# Patient Record
Sex: Male | Born: 1945 | Race: White | Hispanic: No | Marital: Single | Smoking: Former smoker
Health system: Southern US, Community
[De-identification: ages and names within clinical notes are randomized; demographics above are authoritative.]

## PROBLEM LIST (undated history)

## (undated) DIAGNOSIS — K219 Gastro-esophageal reflux disease without esophagitis: Secondary | ICD-10-CM

## (undated) DIAGNOSIS — G629 Polyneuropathy, unspecified: Secondary | ICD-10-CM

## (undated) DIAGNOSIS — R29898 Other symptoms and signs involving the musculoskeletal system: Secondary | ICD-10-CM

## (undated) DIAGNOSIS — M898X9 Other specified disorders of bone, unspecified site: Secondary | ICD-10-CM

## (undated) DIAGNOSIS — M1712 Unilateral primary osteoarthritis, left knee: Secondary | ICD-10-CM

## (undated) DIAGNOSIS — N401 Enlarged prostate with lower urinary tract symptoms: Secondary | ICD-10-CM

## (undated) DIAGNOSIS — M779 Enthesopathy, unspecified: Secondary | ICD-10-CM

## (undated) DIAGNOSIS — Z972 Presence of dental prosthetic device (complete) (partial): Secondary | ICD-10-CM

## (undated) DIAGNOSIS — M2040 Other hammer toe(s) (acquired), unspecified foot: Secondary | ICD-10-CM

## (undated) DIAGNOSIS — K589 Irritable bowel syndrome without diarrhea: Secondary | ICD-10-CM

## (undated) DIAGNOSIS — I341 Nonrheumatic mitral (valve) prolapse: Secondary | ICD-10-CM

## (undated) DIAGNOSIS — I10 Essential (primary) hypertension: Secondary | ICD-10-CM

## (undated) HISTORY — DX: Other specified disorders of bone, unspecified site: M89.8X9

## (undated) HISTORY — DX: Nonrheumatic mitral (valve) prolapse: I34.1

## (undated) HISTORY — PX: EYE SURGERY: SHX253

## (undated) HISTORY — DX: Other hammer toe(s) (acquired), unspecified foot: M20.40

## (undated) HISTORY — DX: Essential (primary) hypertension: I10

## (undated) HISTORY — DX: Benign prostatic hyperplasia with lower urinary tract symptoms: N40.1

## (undated) HISTORY — DX: Irritable bowel syndrome without diarrhea: K58.9

## (undated) HISTORY — DX: Unilateral primary osteoarthritis, left knee: M17.12

## (undated) HISTORY — DX: Enthesopathy, unspecified: M77.9

---

## 1976-11-16 HISTORY — PX: URETEROSCOPY WITH HOLMIUM LASER LITHOTRIPSY: SHX6645

## 2004-12-29 ENCOUNTER — Ambulatory Visit: Payer: Self-pay

## 2006-03-03 ENCOUNTER — Emergency Department: Payer: Self-pay | Admitting: Emergency Medicine

## 2006-05-06 ENCOUNTER — Ambulatory Visit: Payer: Self-pay | Admitting: Specialist

## 2010-03-10 ENCOUNTER — Ambulatory Visit: Payer: Self-pay | Admitting: Urology

## 2012-08-21 ENCOUNTER — Emergency Department: Payer: Self-pay | Admitting: Emergency Medicine

## 2012-08-21 LAB — CBC
HGB: 15 g/dL (ref 13.0–18.0)
MCH: 34.5 pg — ABNORMAL HIGH (ref 26.0–34.0)
MCHC: 35.1 g/dL (ref 32.0–36.0)
Platelet: 180 10*3/uL (ref 150–440)
RDW: 12.7 % (ref 11.5–14.5)

## 2012-08-21 LAB — COMPREHENSIVE METABOLIC PANEL
Alkaline Phosphatase: 68 U/L (ref 50–136)
Anion Gap: 8 (ref 7–16)
Calcium, Total: 9.4 mg/dL (ref 8.5–10.1)
Chloride: 109 mmol/L — ABNORMAL HIGH (ref 98–107)
Co2: 26 mmol/L (ref 21–32)
Creatinine: 1.12 mg/dL (ref 0.60–1.30)
EGFR (African American): >60
EGFR (Non-African Amer.): >60
Potassium: 4.2 mmol/L (ref 3.5–5.1)
Sodium: 143 mmol/L (ref 136–145)

## 2012-08-21 LAB — URINALYSIS, COMPLETE
Leukocyte Esterase: NEGATIVE
Protein: 25
RBC,UR: 604 /HPF (ref 0–5)
WBC UR: 3 /HPF (ref 0–5)

## 2012-09-01 ENCOUNTER — Ambulatory Visit: Payer: Self-pay | Admitting: Urology

## 2012-09-01 DIAGNOSIS — N401 Enlarged prostate with lower urinary tract symptoms: Secondary | ICD-10-CM

## 2012-09-01 HISTORY — DX: Benign prostatic hyperplasia with lower urinary tract symptoms: N40.1

## 2013-07-11 ENCOUNTER — Ambulatory Visit: Payer: Self-pay | Admitting: Urology

## 2014-02-20 ENCOUNTER — Ambulatory Visit: Payer: Self-pay | Admitting: Urology

## 2014-02-22 ENCOUNTER — Encounter: Payer: Self-pay | Admitting: Podiatry

## 2014-02-22 ENCOUNTER — Ambulatory Visit (INDEPENDENT_AMBULATORY_CARE_PROVIDER_SITE_OTHER): Payer: Medicare PPO | Admitting: Podiatry

## 2014-02-22 VITALS — Resp 16 | Ht 68.0 in | Wt 155.0 lb

## 2014-02-22 DIAGNOSIS — M7750 Other enthesopathy of unspecified foot: Secondary | ICD-10-CM

## 2014-02-22 DIAGNOSIS — M779 Enthesopathy, unspecified: Principal | ICD-10-CM

## 2014-02-22 DIAGNOSIS — M2040 Other hammer toe(s) (acquired), unspecified foot: Secondary | ICD-10-CM

## 2014-02-22 DIAGNOSIS — M778 Other enthesopathies, not elsewhere classified: Secondary | ICD-10-CM

## 2014-02-22 NOTE — Progress Notes (Signed)
He presents today complaining of pain to the second metatarsophalangeal joint of his left foot. He like to see about getting orthotics made to help offload that area.  Objective: Vital signs are stable he is alert and oriented x3. He has a painful hammertoe deformity second metatarsophalangeal joint of the left foot dorsiflexion and medial deviation a dislocation at the metatarsophalangeal joint. He has pain on end range of motion of the metatarsophalangeal joint. He also has a plantarflexed second metatarsal.  Assessment: Hammertoe deformity with capsulitis second metatarsophalangeal joint of the left foot.  Plan: Discussed etiology pathology conservative versus surgical therapies. An injection around the joint today with Kenalog and local anesthetic and he was scan for orthotics.

## 2014-03-05 ENCOUNTER — Encounter: Payer: Self-pay | Admitting: *Deleted

## 2014-03-05 NOTE — Progress Notes (Signed)
Sent pt post card letting him know orthotics are here. 

## 2014-12-18 DIAGNOSIS — K589 Irritable bowel syndrome without diarrhea: Secondary | ICD-10-CM

## 2014-12-18 DIAGNOSIS — F419 Anxiety disorder, unspecified: Secondary | ICD-10-CM | POA: Insufficient documentation

## 2014-12-18 HISTORY — DX: Irritable bowel syndrome, unspecified: K58.9

## 2015-01-28 ENCOUNTER — Ambulatory Visit: Payer: Self-pay | Admitting: Gastroenterology

## 2015-01-29 ENCOUNTER — Ambulatory Visit: Payer: Self-pay | Admitting: Gastroenterology

## 2015-03-11 LAB — SURGICAL PATHOLOGY

## 2015-08-05 ENCOUNTER — Other Ambulatory Visit: Payer: Self-pay | Admitting: Physician Assistant

## 2015-08-05 DIAGNOSIS — M5412 Radiculopathy, cervical region: Secondary | ICD-10-CM

## 2015-08-14 ENCOUNTER — Ambulatory Visit
Admission: RE | Admit: 2015-08-14 | Discharge: 2015-08-14 | Disposition: A | Discharge status: Home / Self Care | Payer: Medicare PPO | Source: Ambulatory Visit | Attending: Physician Assistant | Admitting: Physician Assistant

## 2015-08-14 DIAGNOSIS — M5412 Radiculopathy, cervical region: Secondary | ICD-10-CM | POA: Diagnosis present

## 2015-08-14 DIAGNOSIS — M4802 Spinal stenosis, cervical region: Secondary | ICD-10-CM | POA: Diagnosis not present

## 2015-08-14 DIAGNOSIS — M5032 Other cervical disc degeneration, mid-cervical region: Secondary | ICD-10-CM | POA: Diagnosis not present

## 2016-02-04 ENCOUNTER — Encounter: Payer: Self-pay | Admitting: Podiatry

## 2016-02-04 ENCOUNTER — Ambulatory Visit (INDEPENDENT_AMBULATORY_CARE_PROVIDER_SITE_OTHER): Payer: Medicare Other

## 2016-02-04 ENCOUNTER — Ambulatory Visit (INDEPENDENT_AMBULATORY_CARE_PROVIDER_SITE_OTHER): Payer: Medicare Other | Admitting: Podiatry

## 2016-02-04 VITALS — BP 144/95 | HR 90 | Resp 18

## 2016-02-04 DIAGNOSIS — R52 Pain, unspecified: Secondary | ICD-10-CM

## 2016-02-04 DIAGNOSIS — M7752 Other enthesopathy of left foot: Secondary | ICD-10-CM

## 2016-02-04 DIAGNOSIS — M779 Enthesopathy, unspecified: Secondary | ICD-10-CM

## 2016-02-04 DIAGNOSIS — M2040 Other hammer toe(s) (acquired), unspecified foot: Secondary | ICD-10-CM | POA: Diagnosis not present

## 2016-02-04 DIAGNOSIS — M898X9 Other specified disorders of bone, unspecified site: Secondary | ICD-10-CM | POA: Diagnosis not present

## 2016-02-04 DIAGNOSIS — M778 Other enthesopathies, not elsewhere classified: Secondary | ICD-10-CM

## 2016-02-05 ENCOUNTER — Encounter: Payer: Self-pay | Admitting: Podiatry

## 2016-02-05 DIAGNOSIS — M898X9 Other specified disorders of bone, unspecified site: Secondary | ICD-10-CM | POA: Insufficient documentation

## 2016-02-05 DIAGNOSIS — M778 Other enthesopathies, not elsewhere classified: Secondary | ICD-10-CM | POA: Insufficient documentation

## 2016-02-05 DIAGNOSIS — M779 Enthesopathy, unspecified: Secondary | ICD-10-CM

## 2016-02-05 DIAGNOSIS — M2040 Other hammer toe(s) (acquired), unspecified foot: Secondary | ICD-10-CM

## 2016-02-05 HISTORY — DX: Other hammer toe(s) (acquired), unspecified foot: M20.40

## 2016-02-05 HISTORY — DX: Other enthesopathies, not elsewhere classified: M77.8

## 2016-02-05 HISTORY — DX: Other specified disorders of bone, unspecified site: M89.8X9

## 2016-02-05 NOTE — Progress Notes (Signed)
Patient ID: Roger Watson, male   DOB: October 29, 1946, 70 y.o.   MRN: [id number]  Subjective: 70 year old male presents the office today for pain to his left foot. He gets pain in between the second third toes on the second MPJ. He has previous he had injections this area which seems to help. He states has had a bump on the top of his left foot and to the care and over time he has had issues to his left foot for the toes. No recent injury or trauma. No tingling or numbness. No acute changes since he was seen last. Denies any systemic complaints such as fevers, chills, nausea, vomiting. No acute changes since last appointment, and no other complaints at this time.   Objective: AAO x3, NAD DP/PT pulses palpable bilaterally, CRT less than 3 seconds Protective sensation intact with Simms Weinstein monofilament, This is a prominence the dorsal medial aspect of the midfoot with slight irritation from shoe gear. No tenderness along the area at this time. There is a slight hallux varus presents the left side in the second digit is contracted and subluxed dorsally at the level of the MPJ and there is a rigid hammertoe contracture present the second toe. The toe has drifted medially. The remote is also hammered. There is tenderness palpation along the lateral portion of the second MTPJ on the second interspace. There is no palpable neuroma identified. No overlying erythema or increase in warmth. No areas of pinpoint bony tenderness or pain with vibratory sensation. MMT 5/5, ROM WNL. No edema, erythema, increase in warmth to bilateral lower extremities.  No open lesions or pre-ulcerative lesions.  No pain with calf compression, swelling, warmth, erythema  Assessment: 70 year old male second MTPJ capsulitis, intermetatarsal pain.  Plan: -All treatment options discussed with the patient including all alternatives, risks, complications.  -X-rays were obtained and reviewed. No evidence of acute fracture. -At  this time the patient is requesting steroid injection the symptomatic area. Under sterile conditions a mixture of Kenalog and local anesthetic was infiltrated without complications. Postinjection care is discussed. -He brought and multiple pairs of orthotics they which were evaluated. Continue with orthotics. -Follow-up as needed. -Patient encouraged to call the office with any questions, concerns, change in symptoms.   Celesta Gentile, DPM

## 2016-04-14 ENCOUNTER — Encounter: Payer: Self-pay | Admitting: Podiatry

## 2016-04-14 ENCOUNTER — Ambulatory Visit (INDEPENDENT_AMBULATORY_CARE_PROVIDER_SITE_OTHER): Payer: Medicare Other | Admitting: Podiatry

## 2016-04-14 VITALS — BP 125/81 | HR 94 | Resp 18

## 2016-04-14 DIAGNOSIS — M778 Other enthesopathies, not elsewhere classified: Secondary | ICD-10-CM

## 2016-04-14 DIAGNOSIS — M7752 Other enthesopathy of left foot: Secondary | ICD-10-CM

## 2016-04-14 DIAGNOSIS — D3610 Benign neoplasm of peripheral nerves and autonomic nervous system, unspecified: Secondary | ICD-10-CM | POA: Diagnosis not present

## 2016-04-14 DIAGNOSIS — M779 Enthesopathy, unspecified: Principal | ICD-10-CM

## 2016-04-14 NOTE — Progress Notes (Signed)
Patient ID: Roger Watson, male   DOB: 11-18-1945, 70 y.o.   MRN: [id number]  Subjective: 70 year old male presents the office today for follow-up with vibration of left foot pain. He states that the area of the previous injection is doing well however he points the third interspace and the third and fourth metatarsal heads but he gets the majority of tenderness at this time. He states the areas painful weightbearing and pressure. No swelling or redness. No recent injury or trauma. Denies any systemic complaints such as fevers, chills, nausea, vomiting. No acute changes since last appointment, and no other complaints at this time.   Objective: AAO x3, NAD DP/PT pulses palpable bilaterally, CRT less than 3 seconds There is no tenderness palpation on the second interspace and the second MTPJ this time. The majority tenderness appears to be of third interspace. There is prominence the metatarsal heads plantarly with atrophy of fat pad. Hallux varus is present left side of the second digit is in a contracted and subluxed dorsally position. Hammertoe contractures are also present. The second digit has drifted medially.  No areas of pinpoint bony tenderness or pain with vibratory sensation. MMT 5/5, ROM WNL. No edema, erythema, increase in warmth to bilateral lower extremities.  No open lesions or pre-ulcerative lesions.  No pain with calf compression, swelling, warmth, erythema  Assessment: Third interspace pain  Plan: -All treatment options discussed with the patient including all alternatives, risks, complications.  -I discussed treatment options including steroid injection which he wishes to proceed with. Under sterile conditions a mixture of Kenalog and local anesthetic was infiltrated into the area of maximal tenderness without complications. Post injection care was discussed. -I modified his orthotic to add a metatarsal pad. -Ice to the area -Follow-up as scheduled -Patient encouraged to  call the office with any questions, concerns, change in symptoms.   Celesta Gentile, DPM

## 2016-05-12 ENCOUNTER — Ambulatory Visit: Payer: Medicare Other | Admitting: Podiatry

## 2016-10-19 DIAGNOSIS — I341 Nonrheumatic mitral (valve) prolapse: Secondary | ICD-10-CM

## 2016-10-19 HISTORY — DX: Nonrheumatic mitral (valve) prolapse: I34.1

## 2016-11-16 HISTORY — PX: JOINT REPLACEMENT: SHX530

## 2016-12-17 ENCOUNTER — Other Ambulatory Visit: Payer: Self-pay | Admitting: Gastroenterology

## 2016-12-17 DIAGNOSIS — R1011 Right upper quadrant pain: Secondary | ICD-10-CM

## 2016-12-22 ENCOUNTER — Ambulatory Visit
Admission: RE | Admit: 2016-12-22 | Discharge: 2016-12-22 | Disposition: A | Discharge status: Home / Self Care | Payer: Medicare Other | Source: Ambulatory Visit | Attending: Gastroenterology | Admitting: Gastroenterology

## 2016-12-22 DIAGNOSIS — K224 Dyskinesia of esophagus: Secondary | ICD-10-CM | POA: Diagnosis not present

## 2016-12-22 DIAGNOSIS — R1011 Right upper quadrant pain: Secondary | ICD-10-CM

## 2016-12-22 DIAGNOSIS — I77811 Abdominal aortic ectasia: Secondary | ICD-10-CM | POA: Insufficient documentation

## 2016-12-22 DIAGNOSIS — K219 Gastro-esophageal reflux disease without esophagitis: Secondary | ICD-10-CM | POA: Insufficient documentation

## 2016-12-22 DIAGNOSIS — R932 Abnormal findings on diagnostic imaging of liver and biliary tract: Secondary | ICD-10-CM | POA: Diagnosis not present

## 2017-01-29 ENCOUNTER — Emergency Department
Admission: EM | Admit: 2017-01-29 | Discharge: 2017-01-29 | Disposition: A | Discharge status: Home / Self Care | Payer: Medicare Other | Attending: Emergency Medicine | Admitting: Emergency Medicine

## 2017-01-29 ENCOUNTER — Encounter: Payer: Self-pay | Admitting: Emergency Medicine

## 2017-01-29 DIAGNOSIS — I10 Essential (primary) hypertension: Secondary | ICD-10-CM | POA: Insufficient documentation

## 2017-01-29 DIAGNOSIS — R531 Weakness: Secondary | ICD-10-CM | POA: Diagnosis present

## 2017-01-29 DIAGNOSIS — Z79899 Other long term (current) drug therapy: Secondary | ICD-10-CM | POA: Diagnosis not present

## 2017-01-29 HISTORY — DX: Gastro-esophageal reflux disease without esophagitis: K21.9

## 2017-01-29 LAB — BASIC METABOLIC PANEL
ANION GAP: 9 (ref 5–15)
BUN: 14 mg/dL (ref 6–20)
CALCIUM: 9.6 mg/dL (ref 8.9–10.3)
CHLORIDE: 103 mmol/L (ref 101–111)
CO2: 24 mmol/L (ref 22–32)
Creatinine, Ser: 0.89 mg/dL (ref 0.61–1.24)
GFR calc non Af Amer: >60 mL/min (ref >60)
Glucose, Bld: 92 mg/dL (ref 65–99)
POTASSIUM: 3.9 mmol/L (ref 3.5–5.1)
Sodium: 136 mmol/L (ref 135–145)

## 2017-01-29 LAB — URINALYSIS, COMPLETE (UACMP) WITH MICROSCOPIC
Bacteria, UA: NONE SEEN
Bilirubin Urine: NEGATIVE
Glucose, UA: NEGATIVE mg/dL
HGB URINE DIPSTICK: NEGATIVE
KETONES UR: 20 mg/dL — AB
Leukocytes, UA: NEGATIVE
Nitrite: NEGATIVE
PROTEIN: NEGATIVE mg/dL
Specific Gravity, Urine: 1.013 (ref 1.005–1.030)
Squamous Epithelial / LPF: NONE SEEN
pH: 5 (ref 5.0–8.0)

## 2017-01-29 LAB — CBC
HCT: 44.3 % (ref 40.0–52.0)
HEMOGLOBIN: 15.5 g/dL (ref 13.0–18.0)
MCH: 33.6 pg (ref 26.0–34.0)
MCHC: 34.9 g/dL (ref 32.0–36.0)
MCV: 96.4 fL (ref 80.0–100.0)
Platelets: 195 10*3/uL (ref 150–440)
RBC: 4.6 MIL/uL (ref 4.40–5.90)
RDW: 13.2 % (ref 11.5–14.5)
WBC: 6.9 10*3/uL (ref 3.8–10.6)

## 2017-01-29 LAB — TROPONIN I
Troponin I: <0.03 ng/mL (ref <0.03)
Troponin I: <0.03 ng/mL (ref <0.03)

## 2017-01-29 MED ORDER — HYDROCHLOROTHIAZIDE 12.5 MG PO CAPS: 12.5000 mg | ORAL_CAPSULE | Freq: Every day | ORAL | 2 refills | Status: DC

## 2017-01-29 NOTE — ED Provider Notes (Signed)
[facility] Emergency Department Provider Note  Time seen: 5:23 PM  I have reviewed the triage vital signs and the nursing notes.   HISTORY  Chief Complaint Fatigue    HPI Roger Watson is a 71 y.o. male with a past medical history of anxiety, gastric reflux, presents to the emergency department for elevated blood pressure and some weakness. According to the patient for the past 2-3 days he has been experiencing some generalized weakness. States he went to see his GI doctor yesterday for a routine follow-up appointment and at that time was told blood pressure was approximately 160/100. The patient states this is high for him. He took his blood pressure again at home today and it was 160/90. Patient was concerned he went to urgent care for evaluation and was referred to the emergency department for further evaluation. The patient states he is feeling well, no complaints at this time. Denies any chest pain at any point. Denies any shortness of breath at any point. Denies abdominal pain, nausea, vomiting, diarrhea, fever, cough or congestion. Patient admits that he is quite anxious about his blood pressure. Denies any leg pain or swelling. Patient states he works out 3 or 4 times per week. Continues to work to remain active.  Past Medical History:  Diagnosis Date  . GERD (gastroesophageal reflux disease)     Patient Active Problem List   Diagnosis Date Noted  . Capsulitis of left foot 02/05/2016  . 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 Bony exostosis 02/05/2016    History reviewed. No pertinent surgical history.  Prior to Admission medications   Medication Sig Start Date End Date Taking? Authorizing Provider  Boswellia-Glucosamine-Vit D (GLUCOSAMINE COMPLEX PO) Take by mouth daily.    Historical Provider, MD  diclofenac (VOLTAREN) 75 MG EC tablet  01/18/14   Historical Provider, MD  diclofenac (VOLTAREN) 75 MG EC tablet  01/18/14   Historical Provider, MD  doxazosin (CARDURA)  4 MG tablet  01/18/14   Historical Provider, MD  doxazosin (CARDURA) 4 MG tablet Take 4 mg by mouth. 03/13/14   Historical Provider, MD  glucosamine-chondroitin (GLUCOSAMINE-CHONDROITIN DS) 500-400 MG tablet Take by mouth. 09/01/12   Historical Provider, MD  hyoscyamine (LEVSIN SL) 0.125 MG SL tablet  02/09/14   Historical Provider, MD  hyoscyamine (LEVSIN SL) 0.125 MG SL tablet Place 0.125-0.25 mg under the tongue. 03/16/14   Historical Provider, MD  ibuprofen (ADVIL,MOTRIN) 200 MG tablet Take by mouth.    Historical Provider, MD  ketoconazole (NIZORAL) 2 % shampoo  01/30/14   Historical Provider, MD  ketoconazole (NIZORAL) 2 % shampoo  01/30/14   Historical Provider, MD  sertraline (ZOLOFT) 50 MG tablet TK 1 T PO QD 12/20/15   Historical Provider, MD    Allergies  Allergen Reactions  . Sulfa Antibiotics Rash    No family history on file.  Social History Social History  Substance Use Topics  . Smoking status: Never Smoker  . Smokeless tobacco: Never Used  . Alcohol use Yes     Comment: daily    Review of Systems Constitutional: Negative for fever Cardiovascular: Negative for chest pain. Respiratory: Negative for shortness of breath. Gastrointestinal: Negative for abdominal pain, vomiting and diarrhea. Genitourinary: Negative for dysuria Neurological: Negative for headaches, focal weakness or numbness. Does state some generalized fatigue over the past 2 or 3 days. 10-point ROS otherwise negative.  ____________________________________________   PHYSICAL EXAM:  VITAL SIGNS: ED Triage Vitals  Enc Vitals Group     BP 01/29/17  1329 (!) 165/97     Pulse Rate 01/29/17 1329 86     Resp 01/29/17 1329 16     Temp 01/29/17 1329 98.8 F (37.1 C)     Temp Source 01/29/17 1329 Oral     SpO2 01/29/17 1329 94 %     Weight 01/29/17 1330 155 lb (70.3 kg)     Height 01/29/17 1330 5\' 8"  (1.727 m)     Head Circumference --      Peak Flow --      Pain Score 01/29/17 1648 0     Pain Loc --       Pain Edu? --      Excl. in [place]? --     Constitutional: Alert and oriented. Well appearing and in no distress. Eyes: Normal exam ENT   Head: Normocephalic and atraumatic.   Mouth/Throat: Mucous membranes are moist. Cardiovascular: Normal rate, regular rhythm. No murmur Respiratory: Normal respiratory effort without tachypnea nor retractions. Breath sounds are clear  Gastrointestinal: Soft and nontender. No distention. Musculoskeletal: Nontender with normal range of motion in all extremities. No lower extremity tenderness or edema. Neurologic:  Normal speech and language. No gross focal neurologic deficits Skin:  Skin is warm, dry and intact.  Psychiatric: Mood and affect are normal. Speech and behavior are normal.   ____________________________________________    EKG  EKG reviewed and interpreted by myself shows normal sinus rhythm at 73 bpm, narrow QRS, normal axis, normal intervals, nonspecific but no concerning ST changes.  ____________________________________________   INITIAL IMPRESSION / ASSESSMENT AND PLAN / ED COURSE  Pertinent labs & imaging results that were available during my care of the patient were reviewed by me and considered in my medical decision making (see chart for details).  The patient presents the emergency department with concerns over his elevated blood pressure. Currently 167/102. Patient also states a sensation of generalized fatigue over the past several days. Patient's basic labs are largely within normal limits. Urinalysis is normal, besides a mild amount of ketones. Currently awaiting cardiac enzymes. If cardiac enzymes are normal we will likely place the patient on a low-dose of hydrochlorothiazide with PCP follow-up this week. Patient is agreeable to this plan.  Urine has come back normal. Patient's labs including cardiac enzymes are normal. Patient remained somewhat hypertensive 167/102. This has been stable for the past several days. We'll  start the patient on low-dose hydrochlorothiazide have the patient follow-up with his primary care doctor this week. Patient is agreeable to plan.  ____________________________________________   FINAL CLINICAL IMPRESSION(S) / ED DIAGNOSES  Hypertension    Harvest Dark, MD 01/29/17 [id number]

## 2017-01-29 NOTE — ED Triage Notes (Signed)
States he has been feeling fatigued lately  But also has had some fluctuating of b/p  weakness

## 2017-01-29 NOTE — ED Triage Notes (Signed)
Pt brought over from [facility] with reports of fatigue and weakness since yesterday.

## 2017-01-29 NOTE — ED Notes (Signed)
Pt reports elevated BP - pt checked his BP at home - he states he was at [facility] yesterday and his BP was elevated - pt denies headaches, N/V, dizziness - pt reports feeling tired today

## 2017-02-08 DIAGNOSIS — I10 Essential (primary) hypertension: Secondary | ICD-10-CM

## 2017-02-08 HISTORY — DX: Essential (primary) hypertension: I10

## 2017-03-17 DIAGNOSIS — G8929 Other chronic pain: Secondary | ICD-10-CM | POA: Insufficient documentation

## 2017-03-17 DIAGNOSIS — M1712 Unilateral primary osteoarthritis, left knee: Secondary | ICD-10-CM

## 2017-03-17 DIAGNOSIS — M25562 Pain in left knee: Secondary | ICD-10-CM

## 2017-03-17 HISTORY — DX: Unilateral primary osteoarthritis, left knee: M17.12

## 2017-03-18 ENCOUNTER — Encounter: Payer: Self-pay | Admitting: Urology

## 2017-03-18 ENCOUNTER — Ambulatory Visit (INDEPENDENT_AMBULATORY_CARE_PROVIDER_SITE_OTHER): Payer: Medicare Other | Admitting: Urology

## 2017-03-18 VITALS — BP 147/84 | HR 96 | Ht 68.0 in | Wt 148.0 lb

## 2017-03-18 DIAGNOSIS — N400 Enlarged prostate without lower urinary tract symptoms: Secondary | ICD-10-CM | POA: Diagnosis not present

## 2017-03-18 DIAGNOSIS — Z87448 Personal history of other diseases of urinary system: Secondary | ICD-10-CM

## 2017-03-18 NOTE — Progress Notes (Signed)
03/18/2017 2:13 PM   Roger Watson [birth year] [id number]  Referring provider: Madelyn Brunner, MD [facility] [place], [place] [address]  Chief Complaint  Patient presents with  . Benign Prostatic Hypertrophy    New Patient    HPI: Pleasant 71 year old male who presents today to discuss history of urethral stricture.  He is followed by his orthopedic surgeon, Dr. Sabra Heck and is considering having a knee replacement in the near future. As part of the surgery, he will need a Foley catheter placed. He has concerns as he previously required cystoscopy, urethral dilation and Foley catheter 10 days in 1990. This was traumatic for him and he seeks to avoid this in the future.  He reports that as a child, he had an episode of gross hematuria. Thereafter, he underwent urethral sounding every 6 months for several years for unknown reasons. He plays that this may be the source of his scar tissue.  After urethral dilation, he had no further issues and is now voiding well.  He did see Dr. Imelda Pillow in the more recent past for urinary symptoms and is placed on Cardura which is been very effective in controlling his urinary symptoms. He undergoes routine rectal exam/PSA screening by his PCP, Dr. Gilford Rile. His most recent PSA was 1.12 on 09/2016.  Today, he voids well without significant frequency or urgency. He gets up once at night to void. His stream is good. He denies any dysuria, gross hematuria, weak stream, spraying stream, or sensation of incomplete bladder emptying. No gross hematuria or UTIs.  Creatinine 0.9 12/17.  PMH: Past Medical History:  Diagnosis Date  . Bony exostosis 02/05/2016  . Capsulitis of left foot 02/05/2016  . Enlarged prostate with lower urinary tract symptoms (LUTS) 09/01/2012  . Essential hypertension 02/08/2017  . GERD (gastroesophageal reflux disease)   . 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 IBS (irritable bowel syndrome) 12/18/2014  . MVP (mitral  valve prolapse) 10/19/2016  . Primary osteoarthritis of left knee 03/17/2017    Surgical History: Past Surgical History:  Procedure Laterality Date  . URETEROSCOPY WITH HOLMIUM LASER LITHOTRIPSY  1978    Home Medications:  Allergies as of 03/18/2017      Reactions   Sulfa Antibiotics Rash      Medication List       Accurate as of 03/18/17  2:13 PM. Always use your most recent med list.          CARDURA 4 MG tablet Generic drug:  doxazosin Take 4 mg by mouth.   hydrochlorothiazide 12.5 MG capsule Commonly known as:  MICROZIDE Take 1 capsule (12.5 mg total) by mouth daily.   sertraline 50 MG tablet Commonly known as:  ZOLOFT TK 1 T PO QD       Allergies:  Allergies  Allergen Reactions  . Sulfa Antibiotics Rash    Family History: No family history on file.  Social History:  reports that he has never smoked. He has never used smokeless tobacco. He reports that he drinks alcohol. He reports that he does not use drugs.  ROS: UROLOGY Frequent Urination?: No Hard to postpone urination?: No Burning/pain with urination?: No Get up at night to urinate?: No Leakage of urine?: No Urine stream starts and stops?: No Trouble starting stream?: No Do you have to strain to urinate?: No Blood in urine?: No Urinary tract infection?: No Sexually transmitted disease?: No Injury to kidneys or bladder?: No Painful intercourse?: No Weak stream?: No Erection problems?: No  Penile pain?: No  Gastrointestinal Nausea?: No Vomiting?: No Indigestion/heartburn?: No Diarrhea?: No Constipation?: No  Constitutional Fever: No Night sweats?: No Weight loss?: No Fatigue?: No  Skin Skin rash/lesions?: No Itching?: No  Eyes Blurred vision?: No Double vision?: No  Ears/Watson/Throat Sore throat?: No Sinus problems?: No  Hematologic/Lymphatic Swollen glands?: No Easy bruising?: No  Cardiovascular Leg swelling?: No Chest pain?: No  Respiratory Cough?: No Shortness of  breath?: No  Endocrine Excessive thirst?: No  Musculoskeletal Back pain?: No Joint pain?: No  Neurological Headaches?: No Dizziness?: No  Psychologic Depression?: No Anxiety?: Yes  Physical Exam: BP (!) 147/84   Pulse 96   Ht 5\' 8"  (1.727 m)   Wt 148 lb (67.1 kg)   BMI 22.50 kg/m   Constitutional:  Alert and oriented, No acute distress. HEENT: [place] AT, moist mucus membranes.  Trachea midline, no masses. Cardiovascular: No clubbing, cyanosis, or edema. Respiratory: Normal respiratory effort, no increased work of breathing. GI: Abdomen is soft, nontender, nondistended, no abdominal masses GU: No CVA tenderness.  Skin: No rashes, bruises or suspicious lesions. Neurologic: Grossly intact, no focal deficits, moving all 4 extremities. Psychiatric: Normal mood and affect.  Laboratory Data: Lab Results  Component Value Date   WBC 6.9 01/29/2017   HGB 15.5 01/29/2017   HCT 44.3 01/29/2017   MCV 96.4 01/29/2017   PLT 195 01/29/2017    Lab Results  Component Value Date   CREATININE 0.89 01/29/2017   PSA as above  Urinalysis UA from 12/17 without evidence of microscopic hematuria or any other significant findings.  Pertinent Imaging: n/a  Assessment & Plan:    1. History of urethral stricture Remote history of urethral stricture requiring dilation 1990 Currently asymptomatic without obstructive symptoms Patient expresses significant concerns about need for dilation/Foley catheter in the future Various options were discussed with the patient. He may be interested in having an office cystoscopy prior to knee surgery here in the office to evaluate for presence or absence of urethral stricture and for planning purposes in order to devise a plan for Foley catheter placement at the time of surgery He is very anxious about cystoscopy and may require a Valium in the office prior to this He will let us know if and when he decides to proceed with knee surgery and will call and  schedule cystoscopy prior that time  2. Benign prostatic hyperplasia without lower urinary tract symptoms Doing well on Cardura Most recent PSA screening up-to-date  Return for cystoscopy.  Hollice Espy, MD  [facility] [address], [place], [place] [address] [phone]

## 2017-04-25 IMAGING — RF DG UGI W/ HIGH DENSITY W/KUB
10 of 17 series · 12 of 24 positions shown · non-contrast
Comparison: None.

CLINICAL DATA: Right upper quadrant pain, no nausea, vomiting or
diarrhea.

EXAM:
UPPER GI SERIES WITH KUB
TECHNIQUE: After obtaining a scout radiograph a routine upper GI series was
performed using thin and high density barium.
FLUOROSCOPY TIME:  Fluoroscopy Time:  0.7 minute
Radiation Exposure Index (if provided by the fluoroscopic device):
9.7 mGy
Number of Acquired Spot Images: 1

[Series 2: t abdomen supine · 0.15mm/px · 1 of 1 slices shown]
[im 1/1]
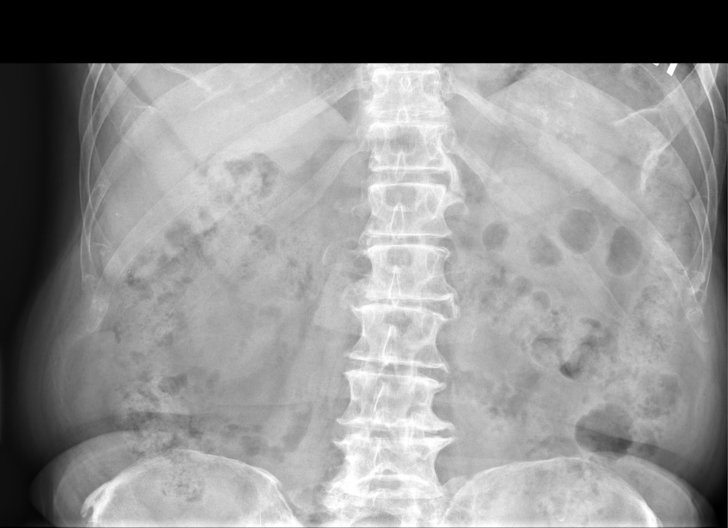

[Series 4: cp_standard · 0.27mm/px · 1 of 1 slices shown (1 of 9)]
[im 1/1]
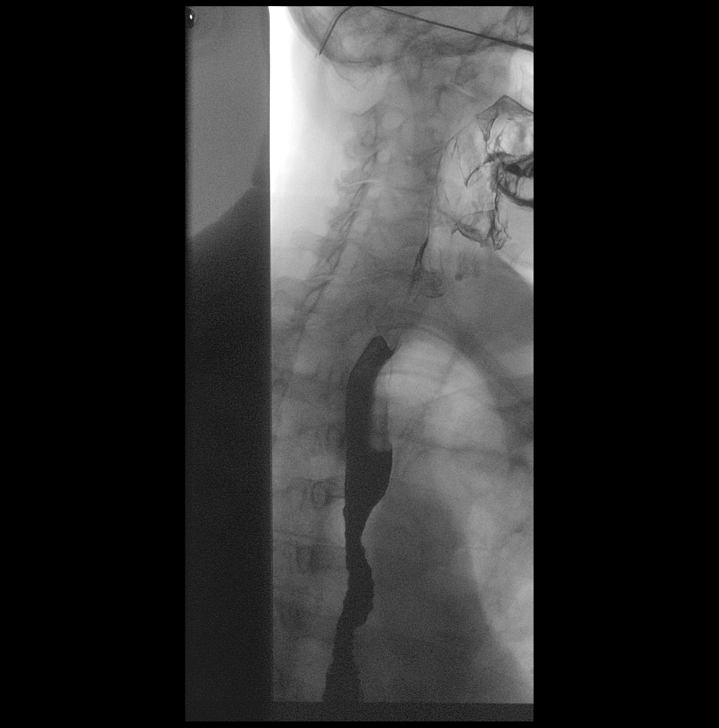

[Series 6: cp_standard · 0.53mm/px · 2 of 36 frames shown (2 of 9)]
[frame 6/36]
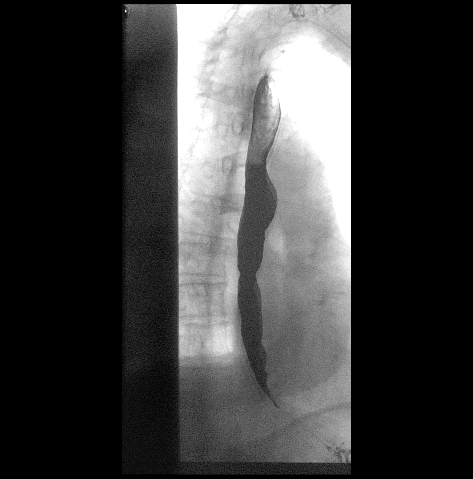
[frame 31/36]
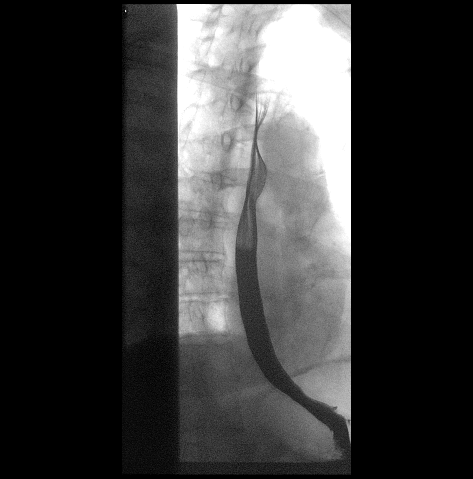

[Series 7: cp_standard · 0.27mm/px · 1 of 1 slices shown (3 of 9)]
[im 1/1]
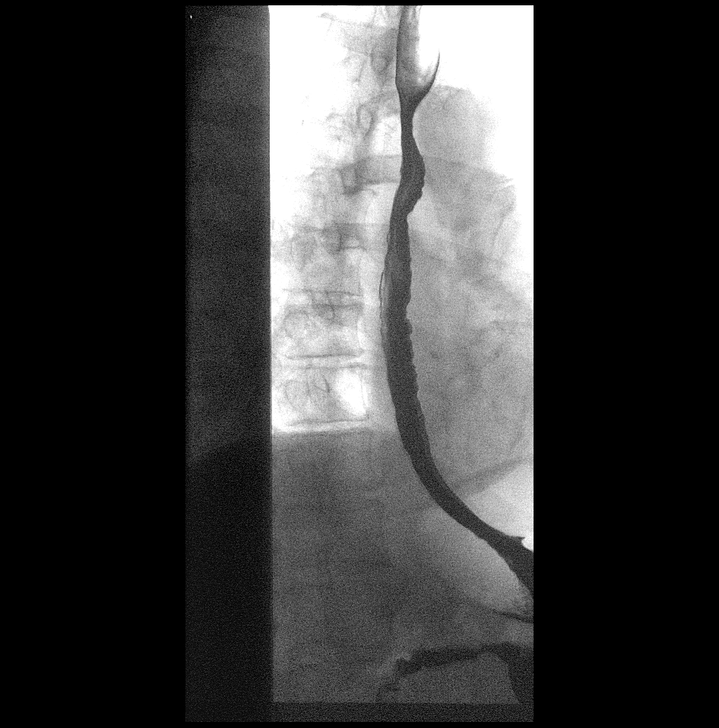

[Series 9: cp_standard · 0.27mm/px · 1 of 1 slices shown (4 of 9)]
[im 1/1]
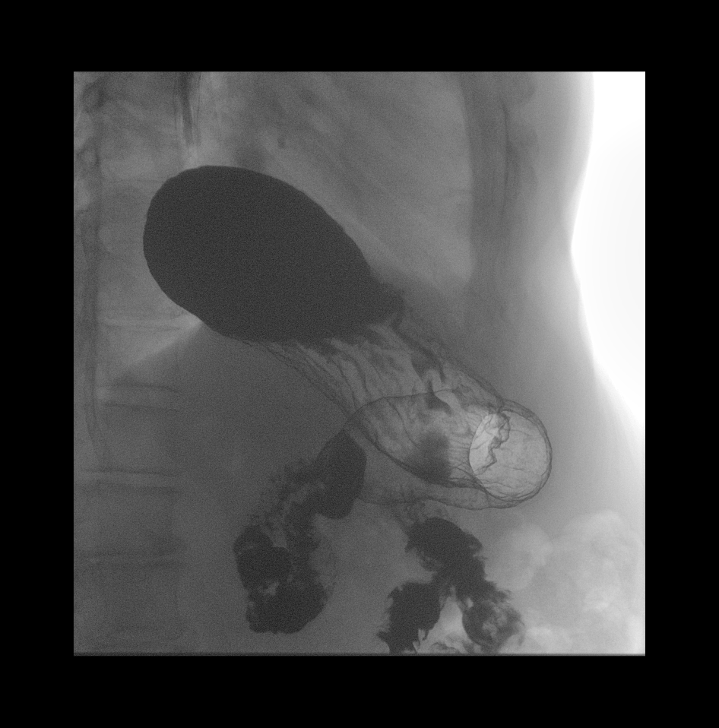

[Series 11: cp_standard · 0.55mm/px · 2 acquisitions, 2 frames shown (5 of 9)]
[im 2/2]
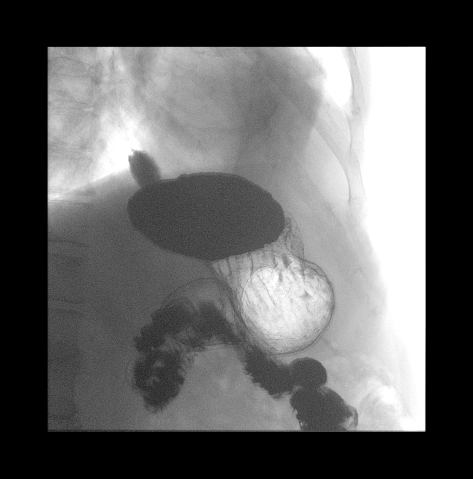
[im 2/2]
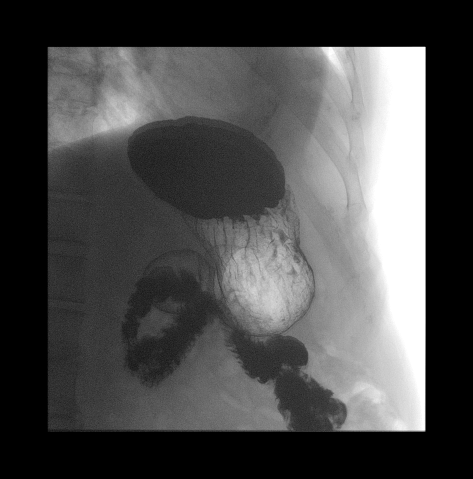

[Series 12: cp_standard · 0.28mm/px · 1 of 1 slices shown (6 of 9)]
[im 1/1]
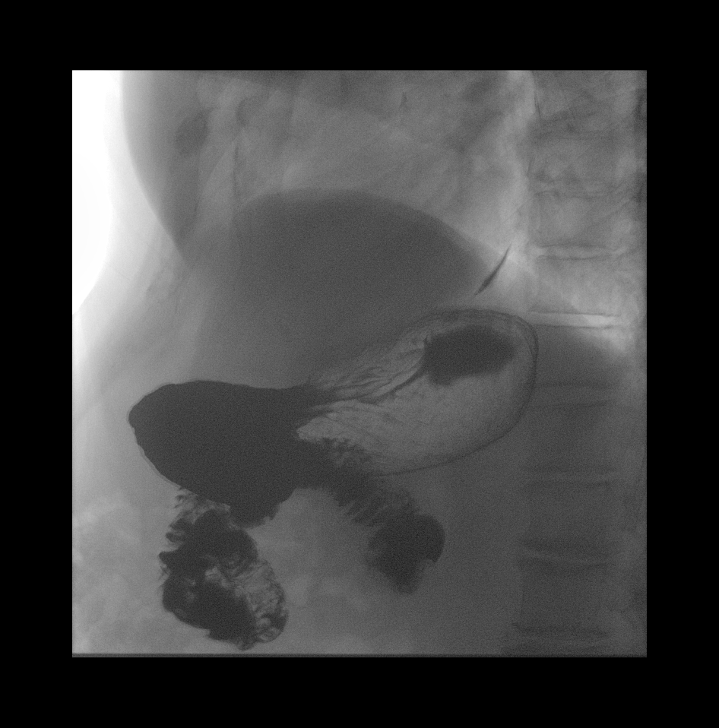

[Series 14: cp_standard · 0.28mm/px · 1 of 1 slices shown (7 of 9)]
[im 1/1]
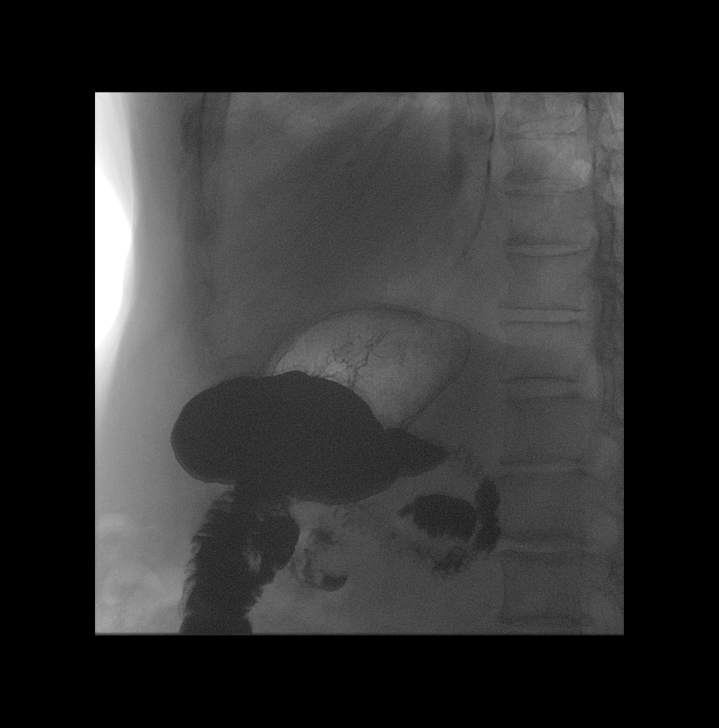

[Series 16: cp_standard · 0.28mm/px · 1 of 1 slices shown (8 of 9)]
[im 1/1]
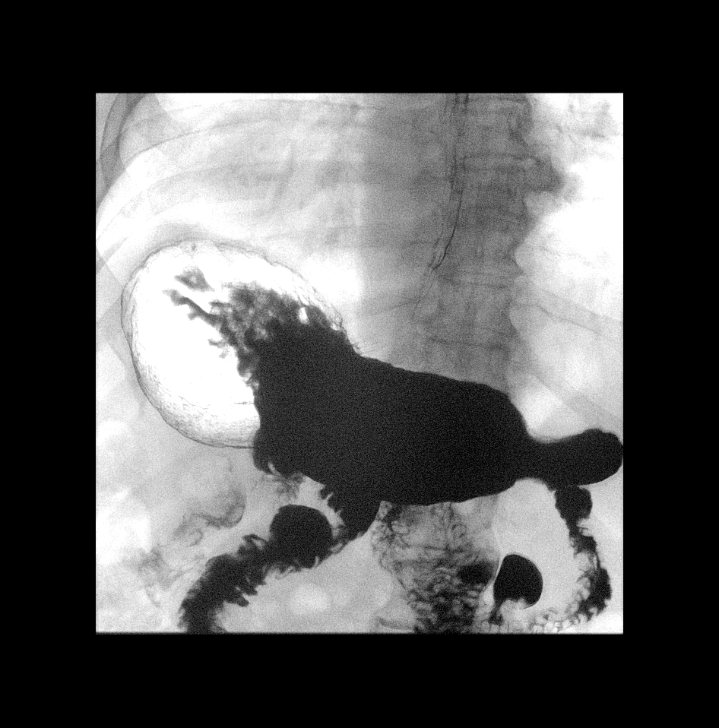

[Series 18: cp_standard · 0.28mm/px · 1 of 1 slices shown (9 of 9)]
[im 1/1]
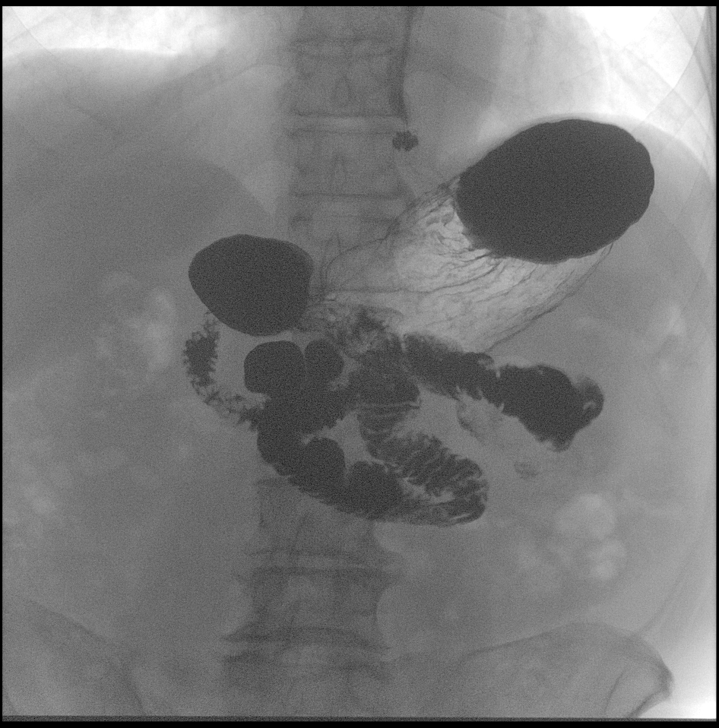

[12 of 24 positions shown; findings below may reference images not displayed]

FINDINGS: KUB:

There is a moderate amount of stool throughout the colon. There is
no bowel dilatation to suggest obstruction. There is no evidence of
pneumoperitoneum, portal venous gas or pneumatosis.

There are no pathologic calcifications along the expected course of
the ureters.

The osseous structures are unremarkable.

UPPER GI:

Examination of the esophagus demonstrated normal esophageal
motility. Normal esophageal morphology without evidence of
esophagitis or ulceration. No esophageal stricture, diverticula, or
mass lesion. No evidence of hiatal hernia. Intermittent tertiary
contractions of the distal third of the esophagus. Moderate
gastroesophageal reflux.

Examination of the stomach demonstrated normal rugal folds and areae
gastricae. The gastric mucosa appeared unremarkable without evidence
of ulceration, scarring, or mass lesion. Gastric motility and
emptying was normal. Fluoroscopic examination of the duodenum
demonstrates normal motility and morphology without evidence of
ulceration or mass lesion.
IMPRESSION: 1. Moderate gastroesophageal reflux.
2. Intermittent tertiary contractions of the distal third of the
esophagus likely reflecting mild spasm.

## 2017-07-30 ENCOUNTER — Encounter: Payer: Self-pay | Admitting: Podiatry

## 2017-07-30 ENCOUNTER — Ambulatory Visit (INDEPENDENT_AMBULATORY_CARE_PROVIDER_SITE_OTHER): Payer: Medicare Other | Admitting: Podiatry

## 2017-07-30 DIAGNOSIS — M779 Enthesopathy, unspecified: Secondary | ICD-10-CM

## 2017-07-30 DIAGNOSIS — M778 Other enthesopathies, not elsewhere classified: Secondary | ICD-10-CM

## 2017-07-30 DIAGNOSIS — M7752 Other enthesopathy of left foot: Secondary | ICD-10-CM

## 2017-07-30 MED ORDER — BETAMETHASONE SOD PHOS & ACET 6 (3-3) MG/ML IJ SUSP: 3.0000 mg | Freq: Once | INTRAMUSCULAR | Status: DC

## 2017-07-30 NOTE — Progress Notes (Signed)
   HPI:  71 year old male who is been seen by different physicians here [facility] presents today for evaluation regarding left foot pain. Patient states that he's experienced left foot pain off and on for several years now. He states the injections helped in the past. He presents today for follow-up treatment and evaluation. Patient denies trauma.  Past Medical History:  Diagnosis Date  . Bony exostosis 02/05/2016  . Capsulitis of left foot 02/05/2016  . Enlarged prostate with lower urinary tract symptoms (LUTS) 09/01/2012  . Essential hypertension 02/08/2017  . GERD (gastroesophageal reflux disease)   . 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 IBS (irritable bowel syndrome) 12/18/2014  . MVP (mitral valve prolapse) 10/19/2016  . Primary osteoarthritis of left knee 03/17/2017     Physical Exam: General: The patient is alert and oriented x3 in no acute distress.  Dermatology: Skin is warm, dry and supple bilateral lower extremities. Negative for open lesions or macerations.  Vascular: Palpable pedal pulses bilaterally. No edema or erythema noted. Capillary refill within normal limits.  Neurological: Epicritic and protective threshold grossly intact bilaterally.   Musculoskeletal Exam: Pain on palpation range of motion of the second MPJ and first interspace of the left foot. There is some dorsal spurring also noted to the first metatarsal cuneiform joint of the left foot consistent with DJD and arthritis. Limited range of motion noted to the foot. Range of motion within normal limits to remaining pedal and ankle joints bilateral. Muscle strength 5/5 in all groups bilateral.   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 - Injection 0.5 mL Celestone Soluspan injected into the second MPJ left foot as well as the first  tarsometatarsal joint of the left foot - Today we also discussed surgical intervention which would include first TMT arthrodesis as well as hammertoe repair of the second digit left foot -  At the moment the patient will continue with conservative modalities including padding and appropriate shoe gear. - Return to clinic when necessary   Edrick Kins, DPM [facility]  Dr. Edrick Kins, DPM    [address], [place] [address]                Office [phone]  Fax [phone]

## 2017-08-13 ENCOUNTER — Encounter: Payer: Self-pay | Admitting: Podiatry

## 2017-08-13 ENCOUNTER — Ambulatory Visit (INDEPENDENT_AMBULATORY_CARE_PROVIDER_SITE_OTHER): Payer: Medicare Other | Admitting: Podiatry

## 2017-08-13 VITALS — BP 149/85 | HR 90 | Temp 97.5°F | Resp 18

## 2017-08-13 DIAGNOSIS — I83025 Varicose veins of left lower extremity with ulcer other part of foot: Secondary | ICD-10-CM

## 2017-08-13 DIAGNOSIS — L97522 Non-pressure chronic ulcer of other part of left foot with fat layer exposed: Secondary | ICD-10-CM | POA: Diagnosis not present

## 2017-08-13 DIAGNOSIS — L97529 Non-pressure chronic ulcer of other part of left foot with unspecified severity: Secondary | ICD-10-CM

## 2017-08-13 MED ORDER — INDOMETHACIN 25 MG PO CAPS: 25.0000 mg | ORAL_CAPSULE | Freq: Two times a day (BID) | ORAL | 0 refills | Status: DC

## 2017-08-13 MED ORDER — DOXYCYCLINE HYCLATE 100 MG PO TABS: 100.0000 mg | ORAL_TABLET | Freq: Two times a day (BID) | ORAL | 0 refills | Status: DC

## 2017-08-13 MED ORDER — GENTAMICIN SULFATE 0.1 % EX CREA: 1.0000 "application " | TOPICAL_CREAM | Freq: Three times a day (TID) | CUTANEOUS | 1 refills | Status: DC

## 2017-08-13 NOTE — Progress Notes (Signed)
   HPI: 71 year old male presents today for a new complaint regarding an ulceration/abrasion to the left foot. He noticed this a few weeks ago and has been applying mupirocin cream. He states the pain is approximately 4 out of 10. He believes the ulcer developed from a possible infection. It hurts with shoe gear.  Past Medical History:  Diagnosis Date  . Bony exostosis 02/05/2016  . Capsulitis of left foot 02/05/2016  . Enlarged prostate with lower urinary tract symptoms (LUTS) 09/01/2012  . Essential hypertension 02/08/2017  . GERD (gastroesophageal reflux disease)   . 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 IBS (irritable bowel syndrome) 12/18/2014  . MVP (mitral valve prolapse) 10/19/2016  . Primary osteoarthritis of left knee 03/17/2017     Physical Exam: General: The patient is alert and oriented x3 in no acute distress.  Dermatology: Skin is warm, dry and supple bilateral lower extremities. Negative for open lesions or macerations.Full-thickness skin ulceration noted to the dorsal medial aspect of left foot overlying the first metatarsal cuneiform bony exostosis. Ulcer measures proximal a [id number]. Ulceration is red and granular with mild localized periwound cellulitis. To the noted ulceration there is no eschar there is a moderate amount of slough fibrin and necrotic tissue noted. Granulation tissue and wound base is red. Moderate serosanguineous drainage noted.  Vascular: Palpable pedal pulses bilaterally. No edema or erythema noted. Capillary refill within normal limits.  Neurological: Epicritic and protective threshold grossly intact bilaterally.   Musculoskeletal Exam: Range of motion within normal limits to all pedal and ankle joints bilateral. Muscle strength 5/5 in all groups bilateral.    Assessment: -  Ulcer left foot likely secondary to venous insufficiency - Varicosities bilateral lower extremities  Plan of Care:  - Patient was evaluated today. - Medically necessary excisional  debridement including subcutaneous tissues performed using a tissue nipper. Excisional debridement of all the necrotic nonviable tissue down to healthy bleeding viable tissue was performed with post-debridement measurements same as pre-. - Prescription for gentamicin cream. Recommend antibiotic cream and a Band-Aid daily -  Prescription for doxycycline #20 - Return to clinic in 2 weeks  Edrick Kins, DPM [facility]  Dr. Edrick Kins, DPM    [address], [place] [address]                Office [phone]  Fax [phone]

## 2017-08-27 ENCOUNTER — Ambulatory Visit: Payer: Medicare Other | Admitting: Podiatry

## 2017-08-31 ENCOUNTER — Ambulatory Visit: Payer: Medicare Other | Admitting: Podiatry

## 2017-10-19 ENCOUNTER — Encounter
Admission: EM | Disposition: A | Discharge status: Home Health | Payer: Self-pay | Source: Home / Self Care | Attending: Internal Medicine

## 2017-10-19 ENCOUNTER — Inpatient Hospital Stay
Admission: EM | Admit: 2017-10-19 | Discharge: 2017-10-21 | DRG: 470 | Disposition: A | Discharge status: Home Health | Payer: Medicare Other | Attending: Internal Medicine | Admitting: Internal Medicine

## 2017-10-19 ENCOUNTER — Inpatient Hospital Stay: Payer: Medicare Other | Admitting: Anesthesiology

## 2017-10-19 ENCOUNTER — Inpatient Hospital Stay: Payer: Medicare Other

## 2017-10-19 ENCOUNTER — Emergency Department: Payer: Medicare Other

## 2017-10-19 ENCOUNTER — Other Ambulatory Visit: Payer: Self-pay

## 2017-10-19 DIAGNOSIS — Y9301 Activity, walking, marching and hiking: Secondary | ICD-10-CM | POA: Diagnosis present

## 2017-10-19 DIAGNOSIS — Z882 Allergy status to sulfonamides status: Secondary | ICD-10-CM | POA: Diagnosis not present

## 2017-10-19 DIAGNOSIS — M7752 Other enthesopathy of left foot: Secondary | ICD-10-CM

## 2017-10-19 DIAGNOSIS — S72002A Fracture of unspecified part of neck of left femur, initial encounter for closed fracture: Secondary | ICD-10-CM | POA: Diagnosis present

## 2017-10-19 DIAGNOSIS — W010XXA Fall on same level from slipping, tripping and stumbling without subsequent striking against object, initial encounter: Secondary | ICD-10-CM | POA: Diagnosis present

## 2017-10-19 DIAGNOSIS — Z419 Encounter for procedure for purposes other than remedying health state, unspecified: Secondary | ICD-10-CM

## 2017-10-19 DIAGNOSIS — W19XXXA Unspecified fall, initial encounter: Secondary | ICD-10-CM

## 2017-10-19 DIAGNOSIS — N401 Enlarged prostate with lower urinary tract symptoms: Secondary | ICD-10-CM | POA: Diagnosis present

## 2017-10-19 DIAGNOSIS — Z881 Allergy status to other antibiotic agents status: Secondary | ICD-10-CM

## 2017-10-19 DIAGNOSIS — I341 Nonrheumatic mitral (valve) prolapse: Secondary | ICD-10-CM | POA: Diagnosis present

## 2017-10-19 DIAGNOSIS — K219 Gastro-esophageal reflux disease without esophagitis: Secondary | ICD-10-CM | POA: Diagnosis present

## 2017-10-19 DIAGNOSIS — M779 Enthesopathy, unspecified: Secondary | ICD-10-CM

## 2017-10-19 DIAGNOSIS — F329 Major depressive disorder, single episode, unspecified: Secondary | ICD-10-CM | POA: Diagnosis present

## 2017-10-19 DIAGNOSIS — M1612 Unilateral primary osteoarthritis, left hip: Secondary | ICD-10-CM | POA: Diagnosis present

## 2017-10-19 DIAGNOSIS — I10 Essential (primary) hypertension: Secondary | ICD-10-CM | POA: Diagnosis present

## 2017-10-19 DIAGNOSIS — M1712 Unilateral primary osteoarthritis, left knee: Secondary | ICD-10-CM | POA: Diagnosis present

## 2017-10-19 DIAGNOSIS — M778 Other enthesopathies, not elsewhere classified: Secondary | ICD-10-CM

## 2017-10-19 DIAGNOSIS — G8918 Other acute postprocedural pain: Secondary | ICD-10-CM

## 2017-10-19 DIAGNOSIS — M25552 Pain in left hip: Secondary | ICD-10-CM | POA: Diagnosis present

## 2017-10-19 DIAGNOSIS — S72009A Fracture of unspecified part of neck of unspecified femur, initial encounter for closed fracture: Secondary | ICD-10-CM | POA: Diagnosis present

## 2017-10-19 HISTORY — PX: ANTERIOR APPROACH HEMI HIP ARTHROPLASTY: SHX6690

## 2017-10-19 LAB — COMPREHENSIVE METABOLIC PANEL
ALBUMIN: 4.3 g/dL (ref 3.5–5.0)
ALK PHOS: 46 U/L (ref 38–126)
ALT: 21 U/L (ref 17–63)
ANION GAP: 12 (ref 5–15)
AST: 29 U/L (ref 15–41)
BILIRUBIN TOTAL: 1.2 mg/dL (ref 0.3–1.2)
BUN: 13 mg/dL (ref 6–20)
CALCIUM: 8.8 mg/dL — AB (ref 8.9–10.3)
CO2: 22 mmol/L (ref 22–32)
Chloride: 103 mmol/L (ref 101–111)
Creatinine, Ser: 1.08 mg/dL (ref 0.61–1.24)
GFR calc Af Amer: >60 mL/min (ref >60)
GFR calc non Af Amer: >60 mL/min (ref >60)
GLUCOSE: 104 mg/dL — AB (ref 65–99)
Potassium: 3.6 mmol/L (ref 3.5–5.1)
SODIUM: 137 mmol/L (ref 135–145)
TOTAL PROTEIN: 6.7 g/dL (ref 6.5–8.1)

## 2017-10-19 LAB — CBC WITH DIFFERENTIAL/PLATELET
BASOS ABS: 0.1 10*3/uL (ref 0–0.1)
BASOS PCT: 1 %
EOS ABS: 0.2 10*3/uL (ref 0–0.7)
Eosinophils Relative: 3 %
HEMATOCRIT: 42.7 % (ref 40.0–52.0)
HEMOGLOBIN: 14.6 g/dL (ref 13.0–18.0)
Lymphocytes Relative: 15 %
Lymphs Abs: 1.1 10*3/uL (ref 1.0–3.6)
MCH: 33.8 pg (ref 26.0–34.0)
MCHC: 34.1 g/dL (ref 32.0–36.0)
MCV: 99.1 fL (ref 80.0–100.0)
MONOS PCT: 9 %
Monocytes Absolute: 0.7 10*3/uL (ref 0.2–1.0)
NEUTROS ABS: 5.5 10*3/uL (ref 1.4–6.5)
NEUTROS PCT: 72 %
Platelets: 155 10*3/uL (ref 150–440)
RBC: 4.3 MIL/uL — AB (ref 4.40–5.90)
RDW: 12.5 % (ref 11.5–14.5)
WBC: 7.6 10*3/uL (ref 3.8–10.6)

## 2017-10-19 LAB — TSH: TSH: 6.889 u[IU]/mL — ABNORMAL HIGH (ref 0.350–4.500)

## 2017-10-19 LAB — TROPONIN I: Troponin I: <0.03 ng/mL (ref <0.03)

## 2017-10-19 LAB — SURGICAL PCR SCREEN
MRSA, PCR: NEGATIVE
STAPHYLOCOCCUS AUREUS: NEGATIVE

## 2017-10-19 SURGERY — HEMIARTHROPLASTY, HIP, DIRECT ANTERIOR APPROACH, FOR FRACTURE
Anesthesia: General | Laterality: Left

## 2017-10-19 MED ORDER — MAGNESIUM HYDROXIDE 400 MG/5ML PO SUSP
30.0000 mL | Freq: Every day | ORAL | Status: DC | PRN
  Administered 2017-10-20: 30 mL via ORAL
  Filled 2017-10-19: qty 30

## 2017-10-19 MED ORDER — SUCCINYLCHOLINE CHLORIDE 20 MG/ML IJ SOLN
INTRAMUSCULAR | Status: DC | PRN
  Administered 2017-10-19: 100 mg via INTRAVENOUS

## 2017-10-19 MED ORDER — ONDANSETRON HCL 4 MG/2ML IJ SOLN
INTRAMUSCULAR | Status: DC | PRN
  Administered 2017-10-19: 4 mg via INTRAVENOUS

## 2017-10-19 MED ORDER — CITALOPRAM HYDROBROMIDE 20 MG PO TABS
20.0000 mg | ORAL_TABLET | Freq: Every day | ORAL | Status: DC
Start: 2017-10-19 — End: 2017-10-21
  Administered 2017-10-20 – 2017-10-21 (×2): 20 mg via ORAL
  Filled 2017-10-19 (×3): qty 1

## 2017-10-19 MED ORDER — DEXAMETHASONE SODIUM PHOSPHATE 10 MG/ML IJ SOLN
INTRAMUSCULAR | Status: AC
  Filled 2017-10-19: qty 1

## 2017-10-19 MED ORDER — PANTOPRAZOLE SODIUM 40 MG PO TBEC
40.0000 mg | DELAYED_RELEASE_TABLET | Freq: Every day | ORAL | Status: DC
  Administered 2017-10-20 – 2017-10-21 (×2): 40 mg via ORAL
  Filled 2017-10-19 (×3): qty 1

## 2017-10-19 MED ORDER — EPHEDRINE SULFATE 50 MG/ML IJ SOLN
INTRAMUSCULAR | Status: DC | PRN
Start: 2017-10-19 — End: 2017-10-20
  Administered 2017-10-19: 5 mg via INTRAVENOUS

## 2017-10-19 MED ORDER — FENTANYL CITRATE (PF) 100 MCG/2ML IJ SOLN
INTRAMUSCULAR | Status: AC
  Administered 2017-10-19: 25 ug via INTRAVENOUS
  Filled 2017-10-19: qty 2

## 2017-10-19 MED ORDER — ACETAMINOPHEN 10 MG/ML IV SOLN
INTRAVENOUS | Status: AC
  Filled 2017-10-19: qty 100

## 2017-10-19 MED ORDER — CEFAZOLIN SODIUM-DEXTROSE 1-4 GM/50ML-% IV SOLN
1.0000 g | Freq: Once | INTRAVENOUS | Status: DC
  Filled 2017-10-19: qty 50

## 2017-10-19 MED ORDER — BUPIVACAINE-EPINEPHRINE 0.25% -1:200000 IJ SOLN
INTRAMUSCULAR | Status: DC | PRN
  Administered 2017-10-19: 30 mL

## 2017-10-19 MED ORDER — MENTHOL 3 MG MT LOZG
1.0000 | LOZENGE | OROMUCOSAL | Status: DC | PRN
  Filled 2017-10-19: qty 9

## 2017-10-19 MED ORDER — PROPOFOL 500 MG/50ML IV EMUL
INTRAVENOUS | Status: AC
  Filled 2017-10-19: qty 50

## 2017-10-19 MED ORDER — FENTANYL CITRATE (PF) 100 MCG/2ML IJ SOLN
INTRAMUSCULAR | Status: DC | PRN
  Administered 2017-10-19: 25 ug via INTRAVENOUS
  Administered 2017-10-19 (×2): 50 ug via INTRAVENOUS

## 2017-10-19 MED ORDER — PHENOL 1.4 % MT LIQD
1.0000 | OROMUCOSAL | Status: DC | PRN
  Filled 2017-10-19: qty 177

## 2017-10-19 MED ORDER — METHOCARBAMOL 1000 MG/10ML IJ SOLN
500.0000 mg | Freq: Four times a day (QID) | INTRAVENOUS | Status: DC | PRN
  Filled 2017-10-19: qty 5

## 2017-10-19 MED ORDER — LIDOCAINE HCL (PF) 2 % IJ SOLN
INTRAMUSCULAR | Status: AC
  Filled 2017-10-19: qty 10

## 2017-10-19 MED ORDER — DEXTROSE-NACL 5-0.9 % IV SOLN
INTRAVENOUS | Status: DC
  Administered 2017-10-19: 08:00:00 via INTRAVENOUS

## 2017-10-19 MED ORDER — MORPHINE SULFATE (PF) 2 MG/ML IV SOLN
2.0000 mg | INTRAVENOUS | Status: DC | PRN
  Administered 2017-10-19 (×4): 2 mg via INTRAVENOUS
  Filled 2017-10-19 (×4): qty 1

## 2017-10-19 MED ORDER — DIPHENHYDRAMINE HCL 12.5 MG/5ML PO ELIX: 12.5000 mg | ORAL_SOLUTION | ORAL | Status: DC | PRN

## 2017-10-19 MED ORDER — DEXTROSE 5 % IV SOLN
Freq: Four times a day (QID) | INTRAVENOUS | Status: AC
  Administered 2017-10-20 (×3): via INTRAVENOUS
  Filled 2017-10-19 (×3): qty 10

## 2017-10-19 MED ORDER — BISACODYL 10 MG RE SUPP: 10.0000 mg | Freq: Every day | RECTAL | Status: DC | PRN

## 2017-10-19 MED ORDER — SUGAMMADEX SODIUM 200 MG/2ML IV SOLN
INTRAVENOUS | Status: DC | PRN
Start: 2017-10-19 — End: 2017-10-20
  Administered 2017-10-19: 140 mg via INTRAVENOUS

## 2017-10-19 MED ORDER — SEVOFLURANE IN SOLN
RESPIRATORY_TRACT | Status: AC
  Filled 2017-10-19: qty 250

## 2017-10-19 MED ORDER — ONDANSETRON HCL 4 MG/2ML IJ SOLN: 4.0000 mg | Freq: Four times a day (QID) | INTRAMUSCULAR | Status: DC | PRN

## 2017-10-19 MED ORDER — BUPIVACAINE-EPINEPHRINE (PF) 0.25% -1:200000 IJ SOLN
INTRAMUSCULAR | Status: AC
  Filled 2017-10-19: qty 30

## 2017-10-19 MED ORDER — MIDAZOLAM HCL 5 MG/5ML IJ SOLN
INTRAMUSCULAR | Status: DC | PRN
  Administered 2017-10-19: 2 mg via INTRAVENOUS

## 2017-10-19 MED ORDER — CEFAZOLIN SODIUM-DEXTROSE 1-4 GM/50ML-% IV SOLN: 1.0000 g | Freq: Four times a day (QID) | INTRAVENOUS | Status: DC

## 2017-10-19 MED ORDER — FENTANYL CITRATE (PF) 100 MCG/2ML IJ SOLN
INTRAMUSCULAR | Status: AC
  Filled 2017-10-19: qty 2

## 2017-10-19 MED ORDER — ROCURONIUM BROMIDE 100 MG/10ML IV SOLN
INTRAVENOUS | Status: DC | PRN
  Administered 2017-10-19: 30 mg via INTRAVENOUS

## 2017-10-19 MED ORDER — NEOMYCIN-POLYMYXIN B GU 40-200000 IR SOLN
Status: AC
  Filled 2017-10-19: qty 4

## 2017-10-19 MED ORDER — METHOCARBAMOL 500 MG PO TABS
500.0000 mg | ORAL_TABLET | Freq: Four times a day (QID) | ORAL | Status: DC | PRN
  Administered 2017-10-19 – 2017-10-21 (×2): 500 mg via ORAL
  Filled 2017-10-19 (×2): qty 1

## 2017-10-19 MED ORDER — ONDANSETRON HCL 4 MG/2ML IJ SOLN
INTRAMUSCULAR | Status: AC
  Filled 2017-10-19: qty 2

## 2017-10-19 MED ORDER — MAGNESIUM CITRATE PO SOLN
1.0000 | Freq: Once | ORAL | Status: DC | PRN
  Filled 2017-10-19: qty 296

## 2017-10-19 MED ORDER — ACETAMINOPHEN 650 MG RE SUPP: 650.0000 mg | Freq: Four times a day (QID) | RECTAL | Status: DC | PRN

## 2017-10-19 MED ORDER — LACTATED RINGERS IV SOLN
INTRAVENOUS | Status: DC | PRN
Start: 2017-10-19 — End: 2017-10-20
  Administered 2017-10-19: 20:00:00 via INTRAVENOUS

## 2017-10-19 MED ORDER — MORPHINE SULFATE (PF) 4 MG/ML IV SOLN
4.0000 mg | Freq: Once | INTRAVENOUS | Status: AC
  Administered 2017-10-19: 4 mg via INTRAVENOUS
  Filled 2017-10-19: qty 1

## 2017-10-19 MED ORDER — SODIUM CHLORIDE 0.9 % IV SOLN
INTRAVENOUS | Status: DC
  Administered 2017-10-19 – 2017-10-20 (×2): via INTRAVENOUS

## 2017-10-19 MED ORDER — ACETAMINOPHEN 325 MG PO TABS: 650.0000 mg | ORAL_TABLET | Freq: Four times a day (QID) | ORAL | Status: DC | PRN

## 2017-10-19 MED ORDER — ONDANSETRON HCL 4 MG PO TABS
4.0000 mg | ORAL_TABLET | Freq: Four times a day (QID) | ORAL | Status: DC | PRN
  Administered 2017-10-19: 4 mg via ORAL
  Filled 2017-10-19: qty 1

## 2017-10-19 MED ORDER — DOXAZOSIN MESYLATE 2 MG PO TABS
2.0000 mg | ORAL_TABLET | Freq: Every day | ORAL | Status: DC
  Administered 2017-10-19 – 2017-10-21 (×3): 2 mg via ORAL
  Filled 2017-10-19 (×3): qty 1

## 2017-10-19 MED ORDER — DOCUSATE SODIUM 100 MG PO CAPS
100.0000 mg | ORAL_CAPSULE | Freq: Two times a day (BID) | ORAL | Status: DC
  Administered 2017-10-20 – 2017-10-21 (×3): 100 mg via ORAL
  Filled 2017-10-19 (×4): qty 1

## 2017-10-19 MED ORDER — ENOXAPARIN SODIUM 40 MG/0.4ML ~~LOC~~ SOLN
40.0000 mg | SUBCUTANEOUS | Status: DC
  Administered 2017-10-20 – 2017-10-21 (×2): 40 mg via SUBCUTANEOUS
  Filled 2017-10-19 (×2): qty 0.4

## 2017-10-19 MED ORDER — ALUM & MAG HYDROXIDE-SIMETH 200-200-20 MG/5ML PO SUSP: 30.0000 mL | ORAL | Status: DC | PRN

## 2017-10-19 MED ORDER — MIDAZOLAM HCL 2 MG/2ML IJ SOLN
INTRAMUSCULAR | Status: AC
  Filled 2017-10-19: qty 2

## 2017-10-19 MED ORDER — DEXAMETHASONE SODIUM PHOSPHATE 10 MG/ML IJ SOLN
INTRAMUSCULAR | Status: DC | PRN
  Administered 2017-10-19: 10 mg via INTRAVENOUS

## 2017-10-19 MED ORDER — SUGAMMADEX SODIUM 200 MG/2ML IV SOLN
INTRAVENOUS | Status: AC
  Filled 2017-10-19: qty 2

## 2017-10-19 MED ORDER — ACETAMINOPHEN 500 MG PO TABS
1000.0000 mg | ORAL_TABLET | Freq: Four times a day (QID) | ORAL | Status: AC
  Administered 2017-10-20 (×4): 1000 mg via ORAL
  Filled 2017-10-19 (×5): qty 2

## 2017-10-19 MED ORDER — OXYCODONE HCL 5 MG PO TABS
10.0000 mg | ORAL_TABLET | ORAL | Status: DC | PRN
  Administered 2017-10-20 – 2017-10-21 (×3): 10 mg via ORAL
  Filled 2017-10-19 (×3): qty 2

## 2017-10-19 MED ORDER — ACETAMINOPHEN 10 MG/ML IV SOLN
INTRAVENOUS | Status: DC | PRN
  Administered 2017-10-19: 1000 mg via INTRAVENOUS

## 2017-10-19 MED ORDER — PROPOFOL 10 MG/ML IV BOLUS
INTRAVENOUS | Status: AC
  Filled 2017-10-19: qty 20

## 2017-10-19 MED ORDER — NEOMYCIN-POLYMYXIN B GU 40-200000 IR SOLN
Status: DC | PRN
  Administered 2017-10-19: 4 mL

## 2017-10-19 MED ORDER — LIDOCAINE HCL (CARDIAC) 20 MG/ML IV SOLN
INTRAVENOUS | Status: DC | PRN
  Administered 2017-10-19: 30 mg via INTRAVENOUS

## 2017-10-19 MED ORDER — ONDANSETRON HCL 4 MG/2ML IJ SOLN
4.0000 mg | Freq: Once | INTRAMUSCULAR | Status: AC
  Administered 2017-10-19: 4 mg via INTRAVENOUS
  Filled 2017-10-19: qty 2

## 2017-10-19 MED ORDER — SUCCINYLCHOLINE CHLORIDE 20 MG/ML IJ SOLN
INTRAMUSCULAR | Status: AC
  Filled 2017-10-19: qty 1

## 2017-10-19 MED ORDER — ONDANSETRON HCL 4 MG/2ML IJ SOLN: 4.0000 mg | Freq: Once | INTRAMUSCULAR | Status: DC | PRN

## 2017-10-19 MED ORDER — FENTANYL CITRATE (PF) 100 MCG/2ML IJ SOLN
25.0000 ug | INTRAMUSCULAR | Status: AC | PRN
  Administered 2017-10-19 (×6): 25 ug via INTRAVENOUS

## 2017-10-19 MED ORDER — ZOLPIDEM TARTRATE 5 MG PO TABS: 5.0000 mg | ORAL_TABLET | Freq: Every evening | ORAL | Status: DC | PRN

## 2017-10-19 MED ORDER — CEFAZOLIN SODIUM 1 G IJ SOLR
1.0000 g | Freq: Once | INTRAMUSCULAR | Status: AC
  Administered 2017-10-19: 1 g via INTRAVENOUS
  Filled 2017-10-19: qty 10

## 2017-10-19 MED ORDER — PROPOFOL 10 MG/ML IV BOLUS
INTRAVENOUS | Status: DC | PRN
  Administered 2017-10-19: 150 mg via INTRAVENOUS

## 2017-10-19 SURGICAL SUPPLY — 52 items
BLADE SAW SAG 18.5X105 (BLADE) ×3 IMPLANT
BNDG COHESIVE 6X5 TAN STRL LF (GAUZE/BANDAGES/DRESSINGS) ×3 IMPLANT
CANISTER SUCT 1200ML W/VALVE (MISCELLANEOUS) ×3 IMPLANT
CAPT HIP TOTAL 3 ×3 IMPLANT
CHLORAPREP W/TINT 26ML (MISCELLANEOUS) ×3 IMPLANT
DRAPE C-ARM XRAY 36X54 (DRAPES) ×3 IMPLANT
DRAPE INCISE IOBAN 66X60 STRL (DRAPES) IMPLANT
DRAPE POUCH INSTRU U-SHP 10X18 (DRAPES) IMPLANT
DRAPE SHEET LG 3/4 BI-LAMINATE (DRAPES) ×9 IMPLANT
DRAPE TABLE BACK 80X90 (DRAPES) ×3 IMPLANT
DRESSING SURGICEL FIBRLLR 1X2 (HEMOSTASIS) ×2 IMPLANT
DRSG OPSITE POSTOP 4X8 (GAUZE/BANDAGES/DRESSINGS) ×3 IMPLANT
DRSG SURGICEL FIBRILLAR 1X2 (HEMOSTASIS) ×6
ELECT BLADE 6.5 EXT (BLADE) ×3 IMPLANT
ELECT REM PT RETURN 9FT ADLT (ELECTROSURGICAL) ×3
ELECTRODE REM PT RTRN 9FT ADLT (ELECTROSURGICAL) ×1 IMPLANT
EVACUATOR 1/8 PVC DRAIN (DRAIN) IMPLANT
GLOVE BIOGEL PI IND STRL 9 (GLOVE) ×2 IMPLANT
GLOVE BIOGEL PI INDICATOR 9 (GLOVE) ×4
GLOVE SURG SYN 9.0  PF PI (GLOVE) ×6
GLOVE SURG SYN 9.0 PF PI (GLOVE) ×3 IMPLANT
GOWN SRG 2XL LVL 4 RGLN SLV (GOWNS) ×1 IMPLANT
GOWN STRL NON-REIN 2XL LVL4 (GOWNS) ×2
GOWN STRL REUS W/ TWL LRG LVL3 (GOWN DISPOSABLE) ×1 IMPLANT
GOWN STRL REUS W/TWL LRG LVL3 (GOWN DISPOSABLE) ×2
HOLDER FOLEY CATH W/STRAP (MISCELLANEOUS) IMPLANT
HOOD PEEL AWAY FLYTE STAYCOOL (MISCELLANEOUS) ×3 IMPLANT
KIT PREVENA INCISION MGT 13 (CANNISTER) IMPLANT
MAT BLUE FLOOR 46X72 FLO (MISCELLANEOUS) ×3 IMPLANT
NDL SAFETY ECLIPSE 18X1.5 (NEEDLE) ×1 IMPLANT
NEEDLE HYPO 18GX1.5 SHARP (NEEDLE) ×2
NEEDLE SPNL 18GX3.5 QUINCKE PK (NEEDLE) ×3 IMPLANT
NS IRRIG 1000ML POUR BTL (IV SOLUTION) ×3 IMPLANT
PACK HIP COMPR (MISCELLANEOUS) ×3 IMPLANT
SOL PREP PVP 2OZ (MISCELLANEOUS) ×3
SOLUTION PREP PVP 2OZ (MISCELLANEOUS) ×1 IMPLANT
SPONGE DRAIN TRACH 4X4 STRL 2S (GAUZE/BANDAGES/DRESSINGS) IMPLANT
STAPLER SKIN PROX 35W (STAPLE) ×3 IMPLANT
STRAP SAFETY BODY (MISCELLANEOUS) ×6 IMPLANT
SUT DVC 2 QUILL PDO  T11 36X36 (SUTURE) ×2
SUT DVC 2 QUILL PDO T11 36X36 (SUTURE) ×1 IMPLANT
SUT SILK 0 (SUTURE) ×2
SUT SILK 0 30XBRD TIE 6 (SUTURE) ×1 IMPLANT
SUT V-LOC 90 ABS DVC 3-0 CL (SUTURE) ×3 IMPLANT
SUT VIC AB 1 CT1 36 (SUTURE) ×3 IMPLANT
SYR 20CC LL (SYRINGE) ×3 IMPLANT
SYR 30ML LL (SYRINGE) ×3 IMPLANT
SYR BULB IRRIG 60ML STRL (SYRINGE) ×3 IMPLANT
TAPE MICROFOAM 4IN (TAPE) IMPLANT
TOWEL OR 17X26 4PK STRL BLUE (TOWEL DISPOSABLE) IMPLANT
TRAY FOLEY W/METER SILVER 16FR (SET/KITS/TRAYS/PACK) IMPLANT
WND VAC CANISTER 500ML (MISCELLANEOUS) IMPLANT

## 2017-10-19 NOTE — H&P (Signed)
Roger Watson is an 71 y.o. male.   Chief Complaint: Fall HPI: The patient with past medical history of mitral valve prolapse, arthritis, IBS and hypertension presents to the emergency department after a mechanical fall.  He denies chest pain, lightheadedness or shortness of breath.  The patient immediately felt pain in his left hip.  X-ray of the hip showed a femoral neck fracture.  Orthopedic surgery was consulted and the emergency department staff called the hospitalist service for admission.  Past Medical History:  Diagnosis Date  . Bony exostosis 02/05/2016  . Capsulitis of left foot 02/05/2016  . Enlarged prostate with lower urinary tract symptoms (LUTS) 09/01/2012  . Essential hypertension 02/08/2017  . GERD (gastroesophageal reflux disease)   . 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 IBS (irritable bowel syndrome) 12/18/2014  . MVP (mitral valve prolapse) 10/19/2016  . Primary osteoarthritis of left knee 03/17/2017    Past Surgical History:  Procedure Laterality Date  . URETEROSCOPY WITH HOLMIUM LASER LITHOTRIPSY  1978    Family History  Problem Relation Age of Onset  . CAD Mother   . CAD Father    Social History:  reports that  has never smoked. he has never used smokeless tobacco. He reports that he drinks alcohol. He reports that he does not use drugs.  Allergies:  Allergies  Allergen Reactions  . Ciprofloxacin Other (See Comments)    Photosensitivity  . Sulfa Antibiotics Rash    Facility-Administered Medications Prior to Admission  Medication Dose Route Frequency Provider Last Rate Last Dose  . betamethasone acetate-betamethasone sodium phosphate (CELESTONE) injection 3 mg  3 mg Intramuscular Once Edrick Kins, DPM       Medications Prior to Admission  Medication Sig Dispense Refill  . citalopram (CELEXA) 20 MG tablet Take 20 mg by mouth daily.    Marland Kitchen doxazosin (CARDURA) 2 MG tablet Take 2 mg by mouth daily.    . meloxicam (MOBIC) 15 MG tablet Take 15 mg by mouth daily as needed  for pain.    . pantoprazole (PROTONIX) 40 MG tablet Take 40 mg by mouth daily.    Marland Kitchen doxycycline (VIBRA-TABS) 100 MG tablet Take 1 tablet (100 mg total) by mouth 2 (two) times daily. (Patient not taking: Reported on 10/19/2017) 20 tablet 0  . gentamicin cream (GARAMYCIN) 0.1 % Apply 1 application topically 3 (three) times daily. (Patient not taking: Reported on 10/19/2017) 30 g 1  . indomethacin (INDOCIN) 25 MG capsule Take 1 capsule (25 mg total) by mouth 2 (two) times daily with a meal. (Patient not taking: Reported on 10/19/2017) 20 capsule 0    Results for orders placed or performed during the hospital encounter of 10/19/17 (from the past 48 hour(s))  CBC with Differential     Status: Abnormal   Collection Time: 10/19/17  4:19 AM  Result Value Ref Range   WBC 7.6 3.8 - 10.6 K/uL   RBC 4.30 (L) 4.40 - 5.90 MIL/uL   Hemoglobin 14.6 13.0 - 18.0 g/dL   HCT 42.7 40.0 - 52.0 %   MCV 99.1 80.0 - 100.0 fL   MCH 33.8 26.0 - 34.0 pg   MCHC 34.1 32.0 - 36.0 g/dL   RDW 12.5 11.5 - 14.5 %   Platelets 155 150 - 440 K/uL   Neutrophils Relative % 72 %   Neutro Abs 5.5 1.4 - 6.5 K/uL   Lymphocytes Relative 15 %   Lymphs Abs 1.1 1.0 - 3.6 K/uL   Monocytes Relative 9 %   Monocytes  Absolute 0.7 0.2 - 1.0 K/uL   Eosinophils Relative 3 %   Eosinophils Absolute 0.2 0 - 0.7 K/uL   Basophils Relative 1 %   Basophils Absolute 0.1 0 - 0.1 K/uL  Comprehensive metabolic panel     Status: Abnormal   Collection Time: 10/19/17  4:19 AM  Result Value Ref Range   Sodium 137 135 - 145 mmol/L   Potassium 3.6 3.5 - 5.1 mmol/L   Chloride 103 101 - 111 mmol/L   CO2 22 22 - 32 mmol/L   Glucose, Bld 104 (H) 65 - 99 mg/dL   BUN 13 6 - 20 mg/dL   Creatinine, Ser 1.08 0.61 - 1.24 mg/dL   Calcium 8.8 (L) 8.9 - 10.3 mg/dL   Total Protein 6.7 6.5 - 8.1 g/dL   Albumin 4.3 3.5 - 5.0 g/dL   AST 29 15 - 41 U/L   ALT 21 17 - 63 U/L   Alkaline Phosphatase 46 38 - 126 U/L   Total Bilirubin 1.2 0.3 - 1.2 mg/dL   GFR calc  non Af Amer >60 >60 mL/min   GFR calc Af Amer >60 >60 mL/min    Comment: (NOTE) The eGFR has been calculated using the CKD EPI equation. This calculation has not been validated in all clinical situations. eGFR's persistently <60 mL/min signify possible Chronic Kidney Disease.    Anion gap 12 5 - 15  Troponin I     Status: None   Collection Time: 10/19/17  4:19 AM  Result Value Ref Range   Troponin I <0.03 <0.03 ng/mL   Dg Chest Portable 1 View  Result Date: 10/19/2017 CLINICAL DATA:  71 year old male with fall. EXAM: PORTABLE CHEST 1 VIEW COMPARISON:  None FINDINGS: The lungs are clear. There is no pleural effusion or pneumothorax. The cardiac silhouette is within normal limits. No acute osseous pathology. IMPRESSION: No acute cardiopulmonary process. Electronically Signed   By: Anner Crete M.D.   On: 10/19/2017 05:16   Dg Hip Unilat With Pelvis 2-3 Views Left  Result Date: 10/19/2017 CLINICAL DATA:  71 year old male with fall and left hip pain. EXAM: DG HIP (WITH OR WITHOUT PELVIS) 2-3V LEFT COMPARISON:  None. FINDINGS: There is a fracture of the left femoral neck. There is proximal migration and impaction of the femur. The left femoral head remains in anatomic alignment with the acetabulum. There is no dislocation. The bones are osteopenic. There is degenerative changes of the visualized lower lumbar spine. The soft tissues appear unremarkable. IMPRESSION: Fracture of the left femoral neck.  No dislocation. Electronically Signed   By: Anner Crete M.D.   On: 10/19/2017 05:14    Review of Systems  Constitutional: Negative for chills and fever.  HENT: Negative for sore throat and tinnitus.   Eyes: Negative for blurred vision and redness.  Respiratory: Negative for cough and shortness of breath.   Cardiovascular: Negative for chest pain, palpitations, orthopnea and PND.  Gastrointestinal: Negative for abdominal pain, diarrhea, nausea and vomiting.  Genitourinary: Negative for  dysuria, frequency and urgency.  Musculoskeletal: Positive for joint pain. Negative for myalgias.  Skin: Negative for rash.       No lesions  Neurological: Negative for speech change, focal weakness and weakness.  Endo/Heme/Allergies: Does not bruise/bleed easily.       No temperature intolerance  Psychiatric/Behavioral: Negative for depression and suicidal ideas.    Blood pressure (!) 147/86, pulse 86, temperature 99.1 F (37.3 C), temperature source Oral, resp. rate 16, height _0  (1.702  m), weight 65.8 kg (145 lb), SpO2 99 %. Physical Exam  Vitals reviewed. Constitutional: He is oriented to person, place, and time. He appears well-developed and well-nourished. No distress.  HENT:  Head: Normocephalic and atraumatic.  Mouth/Throat: Oropharynx is clear and moist.  Eyes: Conjunctivae and EOM are normal. Pupils are equal, round, and reactive to light. No scleral icterus.  Neck: Normal range of motion. Neck supple. No JVD present. No tracheal deviation present. No thyromegaly present.  Cardiovascular: Normal rate, regular rhythm and normal heart sounds. Exam reveals no gallop and no friction rub.  No murmur heard. Respiratory: Effort normal and breath sounds normal. No respiratory distress.  GI: Soft. Bowel sounds are normal. He exhibits no distension. There is no tenderness.  Genitourinary:  Genitourinary Comments: Deferred  Musculoskeletal: He exhibits no edema. Deformity: left leg shortened and internally rotated.  Lymphadenopathy:    He has no cervical adenopathy.  Neurological: He is alert and oriented to person, place, and time. No cranial nerve deficit.  Skin: Skin is warm and dry. No rash noted. No erythema.  Psychiatric: He has a normal mood and affect. His behavior is normal. Judgment and thought content normal.     Assessment/Plan This is a 71 year old male admitted for hip fracture. 1.  Hip fracture: Left femoral neck; manage pain with IV narcotic medication.   Orthopedic surgery consulted.  The patient is n.p.o. in anticipation of surgery.  He is high risk due to advanced age but is otherwise in great health and has high functional status prior to admission. 2.  BPH: Continue doxazosin 3.  Depression: Continue Celexa 4.  DVT prophylaxis: SCDs 5.  GI prophylaxis: None The patient is a full code.  Time spent on admission orders and patient care proximally 45 minutes  Harrie Foreman, MD 10/19/2017, 7:25 AM

## 2017-10-19 NOTE — Anesthesia Preprocedure Evaluation (Signed)
Anesthesia Evaluation  Patient identified by MRN, date of birth, ID band Patient awake    Reviewed: Allergy & Precautions, H&P , NPO status , Patient's Chart, lab work & pertinent test results, reviewed documented beta blocker date and time   Airway Mallampati: II  TM Distance: >3 FB Neck ROM: full    Dental  (+) Teeth Intact   Pulmonary neg pulmonary ROS,    Pulmonary exam normal        Cardiovascular Exercise Tolerance: Good hypertension, On Medications negative cardio ROS Normal cardiovascular exam Rhythm:regular Rate:Normal     Neuro/Psych negative neurological ROS  negative psych ROS   GI/Hepatic negative GI ROS, Neg liver ROS, GERD  Medicated,  Endo/Other  negative endocrine ROS  Renal/GU negative Renal ROS  negative genitourinary   Musculoskeletal   Abdominal   Peds  Hematology negative hematology ROS (+)   Anesthesia Other Findings Past Medical History: 02/05/2016: Bony exostosis 02/05/2016: Capsulitis of left foot 09/01/2012: Enlarged prostate with lower urinary tract symptoms (LUTS) 02/08/2017: Essential hypertension No date: GERD (gastroesophageal reflux disease) 02/05/2016: Hammertoe 12/18/2014: IBS (irritable bowel syndrome) 10/19/2016: MVP (mitral valve prolapse) 03/17/2017: Primary osteoarthritis of left knee Past Surgical History: 1978: URETEROSCOPY WITH HOLMIUM LASER LITHOTRIPSY BMI    Body Mass Index:  22.71 kg/m     Reproductive/Obstetrics negative OB ROS                             Anesthesia Physical Anesthesia Plan  ASA: II and emergent  Anesthesia Plan: General ETT   Post-op Pain Management:    Induction:   PONV Risk Score and Plan: 3  Airway Management Planned:   Additional Equipment:   Intra-op Plan:   Post-operative Plan:   Informed Consent: I have reviewed the patients History and Physical, chart, labs and discussed the procedure including the  risks, benefits and alternatives for the proposed anesthesia with the patient or authorized representative who has indicated his/her understanding and acceptance.   Dental Advisory Given  Plan Discussed with: CRNA  Anesthesia Plan Comments:         Anesthesia Quick Evaluation

## 2017-10-19 NOTE — NC FL2 (Signed)
[place] LEVEL OF CARE SCREENING TOOL     IDENTIFICATION  Patient Name: Roger Watson Birthdate: [birth year] Sex: male Admission Date (Current Location): 10/19/2017  [place] and [place] Number:  Engineering geologist and Address:  [facility], [address], [place], [place] [address]      Provider Number: [id number]  Attending Physician Name and Address:  Gladstone Lighter, MD  Relative Name and Phone Number:       Current Level of Care: Hospital Recommended Level of Care: [place] Prior Approval Number:    Date Approved/Denied:   PASRR Number: [id number])  Discharge Plan: SNF    Current Diagnoses: Patient Active Problem List   Diagnosis Date Noted  . Hip fracture ([place]) 10/19/2017  . Primary osteoarthritis of left knee 03/17/2017  . Chronic pain of left knee 03/17/2017  . Essential hypertension 02/08/2017  . MVP (mitral valve prolapse) 10/19/2016  . Capsulitis of left foot 02/05/2016  . 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 Bony exostosis 02/05/2016  . IBS (irritable bowel syndrome) 12/18/2014  . Anxiety 12/18/2014  . Enlarged prostate with lower urinary tract symptoms (LUTS) 09/01/2012    Orientation RESPIRATION BLADDER Height & Weight     Self, Time, Situation, Place  Normal Continent Weight: 145 lb (65.8 kg) Height:  5\' 7"  (170.2 cm)  BEHAVIORAL SYMPTOMS/MOOD NEUROLOGICAL BOWEL NUTRITION STATUS      Continent Diet(NPO for surgery to be advanced. )  AMBULATORY STATUS COMMUNICATION OF NEEDS Skin   Extensive Assist Verbally Surgical wounds                       Personal Care Assistance Level of Assistance  Bathing, Feeding, Dressing Bathing Assistance: Limited assistance Feeding assistance: Independent Dressing Assistance: Limited assistance     Functional Limitations Info  Sight, Hearing, Speech Sight Info: Adequate Hearing Info: Adequate Speech Info: Adequate    SPECIAL CARE FACTORS FREQUENCY   PT (By licensed PT), OT (By licensed OT)     PT Frequency: (5) OT Frequency: (5)            Contractures      Additional Factors Info  Code Status, Allergies Code Status Info: (Full Code. ) Allergies Info: (Ciprofloxacin, Sulfa Antibiotics)           Current Medications (10/19/2017):  This is the current hospital active medication list Current Facility-Administered Medications  Medication Dose Route Frequency Provider Last Rate Last Dose  . acetaminophen (TYLENOL) tablet 650 mg  650 mg Oral Q6H PRN Harrie Foreman, MD       Or  . acetaminophen (TYLENOL) suppository 650 mg  650 mg Rectal Q6H PRN Harrie Foreman, MD      . ceFAZolin (ANCEF) 1 g in dextrose 5 % 50 mL IVPB  1 g Intravenous Once Hessie Knows, MD   Stopped at 10/19/17 1109  . citalopram (CELEXA) tablet 20 mg  20 mg Oral Daily Harrie Foreman, MD      . dextrose 5 %-0.9 % sodium chloride infusion   Intravenous Continuous Harrie Foreman, MD 100 mL/hr at 10/19/17 0753    . docusate sodium (COLACE) capsule 100 mg  100 mg Oral BID Harrie Foreman, MD      . doxazosin (CARDURA) tablet 2 mg  2 mg Oral Daily Harrie Foreman, MD   2 mg at 10/19/17 1009  . morphine 2 MG/ML injection 2 mg  2 mg Intravenous Q1H PRN Hessie Knows, MD  2 mg at 10/19/17 1106  . ondansetron (ZOFRAN) tablet 4 mg  4 mg Oral Q6H PRN Harrie Foreman, MD   4 mg at 10/19/17 1113   Or  . ondansetron (ZOFRAN) injection 4 mg  4 mg Intravenous Q6H PRN Harrie Foreman, MD      . pantoprazole (PROTONIX) EC tablet 40 mg  40 mg Oral Daily Harrie Foreman, MD         Discharge Medications: Please see discharge summary for a list of discharge medications.  Relevant Imaging Results:  Relevant Lab Results:   Additional Information (SSN: [id number])  Leonidus Rowand, Veronia Beets, LCSW

## 2017-10-19 NOTE — Progress Notes (Signed)
[facility] called for pain medication for moderate pain. New order placed. Will continue to monitor.

## 2017-10-19 NOTE — Clinical Social Work Note (Signed)
Clinical Social Work Assessment  Patient Details  Name: Roger Watson MRN: [id number] Date of Birth: 11/25/1945  Date of referral:  10/19/17               Reason for consult:  Facility Placement                Permission sought to share information with:  Chartered certified accountant granted to share information::  Yes, Verbal Permission Granted  Name::      Roger Watson::   [place]   Relationship::     Contact Information:     Housing/Transportation Living arrangements for the past 2 months:  [place] of Information:  Patient, Partner Patient Interpreter Needed:    Criminal Activity/Legal Involvement Pertinent to Current Situation/Hospitalization:  No - Comment as needed Significant Relationships:  Significant Other Lives with:  Self Do you feel safe going back to the place where you live?  Yes Need for family participation in patient care:  Yes (Comment)  Care giving concerns:  Patient lives alone in [place].    Social Worker assessment / plan:  Holiday representative (CSW) reviewed chart and noted that patient will have surgery for a hip fracture. CSW met with patient alone at bedside prior to surgery today to discuss D/C plan. Patient was alert and oriented X4 and was laying in the bed. CSW introduced self and explained role of CSW department. Patient reported that he lives alone and has no children. Per patient his girlfriend Roger Watson lives down the road from him and is a Marine scientist. CSW explained that after surgery PT will evaluate him and make a recommendation of home health or SNF. Patient prefers to Watson to SNF and requested [facility]. Patient is agreeable to SNF search in [facility]. FL2 complete and faxed out. CSW will continue to follow and assist as needed.   Employment status:  Retired Nurse, adult PT Recommendations:  Not assessed at this time [place] / Referral to  community resources:  [place]  Patient/Family's Response to care:  Patient is agreeable to [facility] in [place].   Patient/Family's Understanding of and Emotional Response to Diagnosis, Current Treatment, and Prognosis: Patient was very pleasant and thanked CSW for assistance.   Emotional Assessment Appearance:  Appears stated age Attitude/Demeanor/Rapport:    Affect (typically observed):  Accepting, Adaptable, Pleasant Orientation:  Oriented to Self, Oriented to Place, Oriented to  Time, Oriented to Situation Alcohol / Substance use:  Not Applicable Psych involvement (Current and /or in the community):  No (Comment)  Discharge Needs  Concerns to be addressed:  Discharge Planning Concerns Readmission within the last 30 days:  No Current discharge risk:  Dependent with Mobility Barriers to Discharge:  Continued Medical Work up   [employer], Roger Beets, LCSW 10/19/2017, 3:05 PM

## 2017-10-19 NOTE — Anesthesia Post-op Follow-up Note (Signed)
Anesthesia QCDR form completed.        

## 2017-10-19 NOTE — Transfer of Care (Signed)
Immediate Anesthesia Transfer of Care Note  Patient: Roger Watson  Procedure(s) Performed: ANTERIOR APPROACH HEMI HIP ARTHROPLASTY (Left )  Patient Location: PACU  Anesthesia Type:General  Level of Consciousness: sedated and patient cooperative  Airway & Oxygen Therapy: Patient Spontanous Breathing and Patient connected to face mask oxygen  Post-op Assessment: Report given to RN and Post -op Vital signs reviewed and stable  Post vital signs: Reviewed and stable  Last Vitals:  Vitals:   10/19/17 1513 10/19/17 1641  BP: (!) 144/83 (!) 144/81  Pulse: 90 92  Resp: 20 18  Temp: 36.9 C 36.8 C  SpO2: 91% 90%    Last Pain:  Vitals:   10/19/17 1722  TempSrc:   PainSc: 8       Patients Stated Pain Goal: 5 (75/44/92 0100)  Complications: No apparent anesthesia complications

## 2017-10-19 NOTE — Progress Notes (Signed)
[facility] [place] NAME: Roger Watson    MR#:  [id number]  DATE OF BIRTH:  04/06/1946  SUBJECTIVE:  CHIEF COMPLAINT:   Chief Complaint  Patient presents with  . Fall    Left Hip Pain   -Had a mechanical fall, active at baseline. Left femoral neck fracture. Going for surgery today. -Denies any cardiac history.  REVIEW OF SYSTEMS:  Review of Systems  Constitutional: Negative for chills, fever and malaise/fatigue.  HENT: Negative for congestion, ear discharge, hearing loss and nosebleeds.   Eyes: Negative for blurred vision and double vision.  Respiratory: Negative for cough, shortness of breath and wheezing.   Cardiovascular: Negative for chest pain, palpitations and leg swelling.  Gastrointestinal: Negative for abdominal pain, constipation, diarrhea, nausea and vomiting.  Genitourinary: Negative for dysuria.  Musculoskeletal: Positive for joint pain and myalgias.  Neurological: Negative for dizziness, speech change, focal weakness, seizures and headaches.    DRUG ALLERGIES:   Allergies  Allergen Reactions  . Ciprofloxacin Other (See Comments)    Photosensitivity  . Sulfa Antibiotics Rash    VITALS:  Blood pressure 135/79, pulse 88, temperature 98.2 F (36.8 C), temperature source Oral, resp. rate 20, height 5\' 7"  (1.702 m), weight 65.8 kg (145 lb), SpO2 94 %.  PHYSICAL EXAMINATION:  Physical Exam  GENERAL:  71 y.o.-year-old patient lying in the bed with no acute distress.  EYES: Pupils equal, round, reactive to light and accommodation. No scleral icterus. Extraocular muscles intact.  HEENT: Head atraumatic, normocephalic. Oropharynx and nasopharynx clear.  NECK:  Supple, no jugular venous distention. No thyroid enlargement, no tenderness.  LUNGS: Normal breath sounds bilaterally, no wheezing, rales,rhonchi or crepitation. No use of accessory muscles of respiration.  CARDIOVASCULAR: S1, S2 normal. No murmurs, rubs, or  gallops.  ABDOMEN: Soft, nontender, nondistended. Bowel sounds present. No organomegaly or mass.  EXTREMITIES: No pedal edema, cyanosis, or clubbing. Left lower extremity is abducted and externally rotated. Swelling in the left groin noted NEUROLOGIC: Cranial nerves II through XII are intact. Muscle strength 5/5 in all extremities except left leg movement is limited secondary to pain. Sensation intact. Gait not checked.  PSYCHIATRIC: The patient is alert and oriented x 3.  SKIN: No obvious rash, lesion, or ulcer.    LABORATORY PANEL:   CBC Recent Labs  Lab 10/19/17 0419  WBC 7.6  HGB 14.6  HCT 42.7  PLT 155   ------------------------------------------------------------------------------------------------------------------  Chemistries  Recent Labs  Lab 10/19/17 0419  NA 137  K 3.6  CL 103  CO2 22  GLUCOSE 104*  BUN 13  CREATININE 1.08  CALCIUM 8.8*  AST 29  ALT 21  ALKPHOS 46  BILITOT 1.2   ------------------------------------------------------------------------------------------------------------------  Cardiac Enzymes Recent Labs  Lab 10/19/17 0419  TROPONINI <0.03   ------------------------------------------------------------------------------------------------------------------  RADIOLOGY:  Dg Chest Portable 1 View  Result Date: 10/19/2017 CLINICAL DATA:  71 year old male with fall. EXAM: PORTABLE CHEST 1 VIEW COMPARISON:  None FINDINGS: The lungs are clear. There is no pleural effusion or pneumothorax. The cardiac silhouette is within normal limits. No acute osseous pathology. IMPRESSION: No acute cardiopulmonary process. Electronically Signed   By: Anner Crete M.D.   On: 10/19/2017 05:16   Dg Hip Unilat With Pelvis 2-3 Views Left  Result Date: 10/19/2017 CLINICAL DATA:  71 year old male with fall and left hip pain. EXAM: DG HIP (WITH OR WITHOUT PELVIS) 2-3V LEFT COMPARISON:  None. FINDINGS: There is a fracture of the left femoral neck. There  is  proximal migration and impaction of the femur. The left femoral head remains in anatomic alignment with the acetabulum. There is no dislocation. The bones are osteopenic. There is degenerative changes of the visualized lower lumbar spine. The soft tissues appear unremarkable. IMPRESSION: Fracture of the left femoral neck.  No dislocation. Electronically Signed   By: Anner Crete M.D.   On: 10/19/2017 05:14    EKG:   Orders placed or performed during the hospital encounter of 10/19/17  . EKG 12-Lead  . EKG 12-Lead  . ED EKG  . ED EKG  . EKG 12-Lead  . EKG 12-Lead    ASSESSMENT AND PLAN:   71 year old male with past medical history significant for GERD, hypertension, mitral valve prolapse, BPH presents to hospital after mechanical fall and left femoral neck fracture.  #1 left femoral neck fracture-low risk for surgery, no known heart disease at baseline and very active. -Appreciate orthopedics consult. Going for surgery today. -Monitor postop hemoglobin, will need DVT prophylaxis after surgery. -Continue pain management and physical therapy after surgery  #2 depression-continue Celexa  #3 BPH-on Cardura  #4 GERD-PPI  #5 DVT prophylaxis-will be started after surgery.     All the records are reviewed and case discussed with Care Management/Social Workerr. Management plans discussed with the patient, family and they are in agreement.  CODE STATUS: Full Code  TOTAL TIME TAKING CARE OF THIS PATIENT: 38 minutes.   POSSIBLE D/C IN 2-3 DAYS, DEPENDING ON CLINICAL CONDITION.   Gladstone Lighter M.D on 10/19/2017 at 10:37 AM  Between 7am to 6pm - Pager - [phone]  After 6pm go to [email or web address] - password [email or web address] [facility]  Office  [phone]  CC: Primary care physician; Madelyn Brunner, MD

## 2017-10-19 NOTE — Anesthesia Procedure Notes (Signed)
Procedure Name: Intubation Date/Time: 10/19/2017 7:45 PM Performed by: Lendon Colonel, CRNA Pre-anesthesia Checklist: Patient identified, Patient being monitored, Timeout performed, Emergency Drugs available and Suction available Patient Re-evaluated:Patient Re-evaluated prior to induction Oxygen Delivery Method: Circle system utilized Preoxygenation: Pre-oxygenation with 100% oxygen Induction Type: IV induction Ventilation: Mask ventilation without difficulty Laryngoscope Size: Miller and 2 Grade View: Grade I Tube type: Oral Tube size: 7.0 mm Number of attempts: 1 Airway Equipment and Method: Stylet Placement Confirmation: ETT inserted through vocal cords under direct vision,  positive ETCO2 and breath sounds checked- equal and bilateral Secured at: 21 cm Tube secured with: Tape Dental Injury: Teeth and Oropharynx as per pre-operative assessment

## 2017-10-19 NOTE — ED Triage Notes (Addendum)
Per EMS, pt fell tonight at home when walking to [place], pt states he slipped and fell. Post fall pt reports pain with movement to left hip. EMS reports on arrival pt's left leg externally rotated. Pt denies LOC. Pt able to move toes in LLE at this time however reports pain with movement on leg. Pt states hx of "left leg 1 inch shorter than right." Pt given 193mcg of fentanyl during transport, VS WNL. Pt A&O and in NAD at this time.

## 2017-10-19 NOTE — Clinical Social Work Placement (Signed)
   CLINICAL SOCIAL WORK PLACEMENT  NOTE  Date:  10/19/2017  Patient Details  Name: Roger Watson MRN: [id number] Date of Birth: December 13, 1945  Clinical Social Work is seeking post-discharge placement for this patient at the [place] level of care (*CSW will initial, date and re-position this form in  chart as items are completed):  Yes   Patient/family provided with [facility] Work Department's list of facilities offering this level of care within the geographic area requested by the patient (or if unable, by the patient's family).  Yes   Patient/family informed of their freedom to choose among providers that offer the needed level of care, that participate in Medicare, Medicaid or managed care program needed by the patient, have an available bed and are willing to accept the patient.  Yes   Patient/family informed of [facility]'s ownership interest in [facility] and [facility], as well as of the fact that they are under no obligation to receive care at these facilities.  PASRR submitted to EDS on 10/19/17     PASRR number received on 10/19/17     Existing PASRR number confirmed on       FL2 transmitted to all facilities in geographic area requested by pt/family on 10/19/17     FL2 transmitted to all facilities within larger geographic area on       Patient informed that his/her managed care company has contracts with or will negotiate with certain facilities, including the following:            Patient/family informed of bed offers received.  Patient chooses bed at       Physician recommends and patient chooses bed at      Patient to be transferred to   on  .  Patient to be transferred to facility by       Patient family notified on   of transfer.  Name of family member notified:        PHYSICIAN       Additional Comment:    _______________________________________________ Delos Klich, Veronia Beets, LCSW 10/19/2017, 3:03 PM

## 2017-10-19 NOTE — ED Notes (Signed)
Patient transported to X-ray 

## 2017-10-19 NOTE — ED Provider Notes (Signed)
[facility] Emergency Department Provider Note   ____________________________________________   First MD Initiated Contact with Patient 10/19/17 [phone]     (approximate)  I have reviewed the triage vital signs and the nursing notes.   HISTORY  Chief Complaint Fall (Left Hip Pain)    HPI Ronin Johnhenry Tippin is a 71 y.o. male brought to the ED from home via EMS with a chief complaint of mechanical fall with left hip pain.  Patient reports he was walking to the bathroom when he slipped and fell, landing on his left side.  Denies striking head or LOC.  Complains of left hip pain which increases with movement.  EMS reports patient's left leg is externally rotated and shortened.  He was given  100 mcgs fentanyl in route to the ED.  States he has a "high hip" in his left leg has always been 1 inch shorter than his right leg.  Denies headache, vision changes, neck pain, chest pain, shortness of breath, abdominal pain, nausea or vomiting.  Denies use of anticoagulants.   Past Medical History:  Diagnosis Date  . Bony exostosis 02/05/2016  . Capsulitis of left foot 02/05/2016  . Enlarged prostate with lower urinary tract symptoms (LUTS) 09/01/2012  . Essential hypertension 02/08/2017  . GERD (gastroesophageal reflux disease)   . 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 IBS (irritable bowel syndrome) 12/18/2014  . MVP (mitral valve prolapse) 10/19/2016  . Primary osteoarthritis of left knee 03/17/2017    Patient Active Problem List   Diagnosis Date Noted  . Hip fracture ([place]) 10/19/2017  . Primary osteoarthritis of left knee 03/17/2017  . Chronic pain of left knee 03/17/2017  . Essential hypertension 02/08/2017  . MVP (mitral valve prolapse) 10/19/2016  . Capsulitis of left foot 02/05/2016  . 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 Bony exostosis 02/05/2016  . IBS (irritable bowel syndrome) 12/18/2014  . Anxiety 12/18/2014  . Enlarged prostate with lower urinary tract symptoms (LUTS) 09/01/2012     Past Surgical History:  Procedure Laterality Date  . URETEROSCOPY WITH HOLMIUM LASER LITHOTRIPSY  1978    Prior to Admission medications   Medication Sig Start Date End Date Taking? Authorizing Provider  citalopram (CELEXA) 20 MG tablet Take 20 mg by mouth daily.   Yes [provider]  doxazosin (CARDURA) 2 MG tablet Take 2 mg by mouth daily.   Yes [provider]  meloxicam (MOBIC) 15 MG tablet Take 15 mg by mouth daily as needed for pain.   Yes [provider]  pantoprazole (PROTONIX) 40 MG tablet Take 40 mg by mouth daily.   Yes [provider]  doxycycline (VIBRA-TABS) 100 MG tablet Take 1 tablet (100 mg total) by mouth 2 (two) times daily. Patient not taking: Reported on 10/19/2017 08/13/17   Edrick Kins, DPM  gentamicin cream (GARAMYCIN) 0.1 % Apply 1 application topically 3 (three) times daily. Patient not taking: Reported on 10/19/2017 08/13/17   Edrick Kins, DPM  indomethacin (INDOCIN) 25 MG capsule Take 1 capsule (25 mg total) by mouth 2 (two) times daily with a meal. Patient not taking: Reported on 10/19/2017 08/13/17   Edrick Kins, DPM    Allergies Ciprofloxacin and Sulfa antibiotics  No family history on file.  Social History Social History   Tobacco Use  . Smoking status: Never Smoker  . Smokeless tobacco: Never Used  Substance Use Topics  . Alcohol use: Yes    Comment: daily  . Drug use: No    Review of  Systems  Constitutional: No fever/chills. Eyes: No visual changes. ENT: No sore throat. Cardiovascular: Denies chest pain. Respiratory: Denies shortness of breath. Gastrointestinal: No abdominal pain.  No nausea, no vomiting.  No diarrhea.  No constipation. Genitourinary: Negative for dysuria. Musculoskeletal: Positive for left hip pain.  Negative for back pain. Skin: Negative for rash. Neurological: Negative for headaches, focal weakness or  numbness.   ____________________________________________   PHYSICAL EXAM:  VITAL SIGNS: ED Triage Vitals [10/19/17 0318]  Enc Vitals Group     BP 134/88     Pulse Rate 86     Resp 19     Temp 97.9 F (36.6 C)     Temp Source Oral     SpO2 96 %     Weight 145 lb (65.8 kg)     Height 5\' 7"  (1.702 m)     Head Circumference      Peak Flow      Pain Score 6     Pain Loc      Pain Edu?      Excl. in [place]?     Constitutional: Alert and oriented. Well appearing and in mild acute distress. Eyes: Conjunctivae are normal. PERRL. EOMI. Head: Atraumatic. Nose: No congestion/rhinnorhea. Mouth/Throat: Mucous membranes are moist.  Oropharynx non-erythematous. Neck: No stridor.  No cervical spine tenderness to palpation. Cardiovascular: Normal rate, regular rhythm. Grossly normal heart sounds.  Good peripheral circulation. Respiratory: Normal respiratory effort.  No retractions. Lungs CTAB. Gastrointestinal: Soft and nontender. No distention. No abdominal bruits. No CVA tenderness. Musculoskeletal: Left leg externally rotated and shortened.  Tender to palpation at inguinal crease.  Limited range of motion secondary to pain.  2+ femoral and distal pulses. Neurologic:  Normal speech and language. No gross focal neurologic deficits are appreciated.  Skin:  Skin is warm, dry and intact. No rash noted. Psychiatric: Mood and affect are normal. Speech and behavior are normal.  ____________________________________________   LABS (all labs ordered are listed, but only abnormal results are displayed)  Labs Reviewed  CBC WITH DIFFERENTIAL/PLATELET - Abnormal; Notable for the following components:      Result Value   RBC 4.30 (*)    All other components within normal limits  COMPREHENSIVE METABOLIC PANEL - Abnormal; Notable for the following components:   Glucose, Bld 104 (*)    Calcium 8.8 (*)    All other components within normal limits  TROPONIN I  TSH    ____________________________________________  EKG  ED ECG REPORT I, SUNG,JADE J, the attending physician, personally viewed and interpreted this ECG.   Date: 10/19/2017  EKG Time: 0421  Rate: 77  Rhythm: normal EKG, normal sinus rhythm  Axis: Normal  Intervals:none  ST&T Change: Nonspecific  ____________________________________________  RADIOLOGY  Dg Chest Portable 1 View  Result Date: 10/19/2017 CLINICAL DATA:  71 year old male with fall. EXAM: PORTABLE CHEST 1 VIEW COMPARISON:  None FINDINGS: The lungs are clear. There is no pleural effusion or pneumothorax. The cardiac silhouette is within normal limits. No acute osseous pathology. IMPRESSION: No acute cardiopulmonary process. Electronically Signed   By: Anner Crete M.D.   On: 10/19/2017 05:16   Dg Hip Unilat With Pelvis 2-3 Views Left  Result Date: 10/19/2017 CLINICAL DATA:  71 year old male with fall and left hip pain. EXAM: DG HIP (WITH OR WITHOUT PELVIS) 2-3V LEFT COMPARISON:  None. FINDINGS: There is a fracture of the left femoral neck. There is proximal migration and impaction of the femur. The left femoral head remains in anatomic alignment  with the acetabulum. There is no dislocation. The bones are osteopenic. There is degenerative changes of the visualized lower lumbar spine. The soft tissues appear unremarkable. IMPRESSION: Fracture of the left femoral neck.  No dislocation. Electronically Signed   By: Anner Crete M.D.   On: 10/19/2017 05:14    ____________________________________________   PROCEDURES  Procedure(s) performed: None  Procedures  Critical Care performed: No  ____________________________________________   INITIAL IMPRESSION / ASSESSMENT AND PLAN / ED COURSE  As part of my medical decision making, I reviewed the following data within the [place] History obtained from family, Nursing notes reviewed and incorporated, Labs reviewed, EKG interpreted, Old chart  reviewed, Radiograph reviewed, Discussed with admitting physician and Notes from prior ED visits.   71 year old male who presents status post mechanical fall with left hip pain.  Left hip appears shortened and externally rotated.  Will administer IV analgesia and obtain x-rays.  Clinical Course as of Oct 19 699  Tue Oct 19, 2017  0523 Updated patient and spouse of laboratory and imaging results.  Discussed with hospitalist Dr. Marcille Blanco who will evaluate patient in the emergency department for admission.  Will notify orthopedics on-call Dr. Rudene Christians.  [JS]    Clinical Course User Index [JS] Paulette Blanch, MD     ____________________________________________   FINAL CLINICAL IMPRESSION(S) / ED DIAGNOSES  Final diagnoses:  Fall, initial encounter  Closed fracture of neck of left femur, initial encounter [facility])     ED Discharge Orders    None       Note:  This document was prepared using Dragon voice recognition software and may include unintentional dictation errors.    Paulette Blanch, MD 10/19/17 0700

## 2017-10-19 NOTE — Consult Note (Signed)
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 He denies prodromal symptoms.  On exam he has no ecchymosis around the hip with the left leg being shortened and externally rotated. He has palpable dorsal pedis pulse is able flex extend the toes.  X-rays reveal a displaced femoral neck fracture with shortening and moderate degenerative changes present  Impression: Displaced femoral neck fracture with pre-existing arthritis  Plan: Left total hip replacement, direct anterior approach later today Risks, benefits, possible complications discussed.

## 2017-10-19 NOTE — Op Note (Signed)
10/19/2017  8:49 PM  PATIENT:  Roger Watson  71 y.o. male  PRE-OPERATIVE DIAGNOSIS:  left hip fracture and degenerative osteoarthritis left hip  POST-OPERATIVE DIAGNOSIS:  same as preop  PROCEDURE:  Procedure(s): ANTERIOR APPROACH HEMI HIP ARTHROPLASTY (Left)  SURGEON: Laurene Footman, MD  ASSISTANTS: None  ANESTHESIA:   general  EBL:  Total I/O In: -  Out: 350 [Blood:350]  BLOOD ADMINISTERED:none  DRAINS: none   LOCAL MEDICATIONS USED:  MARCAINE     SPECIMEN:  Source of Specimen:  Left femoral head  DISPOSITION OF SPECIMEN:  PATHOLOGY  COUNTS:  YES  TOURNIQUET:  * No tourniquets in log *  IMPLANTS: Medacta AMIS 3 standard stem,Mpact DM 54 mm with liner and S 28 mm metal head  DICTATION: .Dragon Dictation   The patient was brought to the operating room and after general anesthesia was obtained patient was placed on the operative table with the ipsilateral foot into the Medacta attachment, contralateral leg on a well-padded table. C-arm was brought in and preop template x-ray taken. After prepping and draping in usual sterile fashion appropriate patient identification and timeout procedures were completed. Anterior approach to the hip was obtained and centered over the greater trochanter and TFL muscle. The subcutaneous tissue was incised hemostasis being achieved by electrocautery. TFL fascia was incised and the muscle retracted laterally deep retractor placed. The lateral femoral circumflex vessels were identified and ligated. The anterior capsule was exposed and a capsulotomy performed. The neck was identified and a femoral neck cut carried out with a saw. The head was removed without difficulty and showed sclerotic femoral head and acetabulum. Reaming was carried out to 52 mm and a 54 mm cup trial gave appropriate tightness to the acetabular component a 54 DM cup was impacted into position. The leg was then externally rotated and ischiofemoral and pubofemoral releases  carried out. The femur was sequentially broached to a size 3, size 3 standard with S head trials were placed and the final components chosen. The 3 standard stem was inserted along with a metal S 28 mm head and 54 mm liner. The hip was reduced and was stable the wound was thoroughly irrigated with fibrillar placed on the posterior capsulotomy and medial capsule. The deep fascia was closed using a heavy Quill after infiltration of 30 cc of quarter percent Sensorcaine with epinephrine. 3-0 v-loc to close the skin with skin staples Xeroform and honeycomb dressing applied  PLAN OF CARE: Continue as an inpatient

## 2017-10-19 NOTE — ED Notes (Signed)
Pt transported to room 138

## 2017-10-19 NOTE — ED Notes (Signed)
Patient transported to Ultrasound 

## 2017-10-20 ENCOUNTER — Encounter: Payer: Self-pay | Admitting: Orthopedic Surgery

## 2017-10-20 LAB — CBC
HEMATOCRIT: 40.3 % (ref 40.0–52.0)
Hemoglobin: 13.8 g/dL (ref 13.0–18.0)
MCH: 34 pg (ref 26.0–34.0)
MCHC: 34.2 g/dL (ref 32.0–36.0)
MCV: 99.4 fL (ref 80.0–100.0)
Platelets: 149 10*3/uL — ABNORMAL LOW (ref 150–440)
RBC: 4.06 MIL/uL — ABNORMAL LOW (ref 4.40–5.90)
RDW: 12.6 % (ref 11.5–14.5)
WBC: 10 10*3/uL (ref 3.8–10.6)

## 2017-10-20 LAB — BASIC METABOLIC PANEL
Anion gap: 7 (ref 5–15)
BUN: 11 mg/dL (ref 6–20)
CHLORIDE: 105 mmol/L (ref 101–111)
CO2: 24 mmol/L (ref 22–32)
Calcium: 8.5 mg/dL — ABNORMAL LOW (ref 8.9–10.3)
Creatinine, Ser: 0.67 mg/dL (ref 0.61–1.24)
GFR calc Af Amer: >60 mL/min (ref >60)
GFR calc non Af Amer: >60 mL/min (ref >60)
GLUCOSE: 172 mg/dL — AB (ref 65–99)
POTASSIUM: 4.2 mmol/L (ref 3.5–5.1)
SODIUM: 136 mmol/L (ref 135–145)

## 2017-10-20 MED ORDER — OXYCODONE HCL 5 MG PO TABS: 5.0000 mg | ORAL_TABLET | ORAL | 0 refills | Status: DC | PRN

## 2017-10-20 MED ORDER — ENOXAPARIN SODIUM 40 MG/0.4ML ~~LOC~~ SOLN: 40.0000 mg | SUBCUTANEOUS | 0 refills | Status: DC

## 2017-10-20 MED ORDER — LACTULOSE 10 GM/15ML PO SOLN: 10.0000 g | Freq: Two times a day (BID) | ORAL | Status: DC | PRN

## 2017-10-20 NOTE — Progress Notes (Signed)
PT is recommending home health. RN case manager aware of above. Please reconsult if future social work needs arise. CSW signing off.   Carlen Rebuck, LCSW [phone]  

## 2017-10-20 NOTE — Progress Notes (Signed)
[facility] NAME: Roger Watson    MR#:  [id number]  DATE OF BIRTH:  1946/04/30  SUBJECTIVE:   Did well today with physical therapy. Plan to go home tomorrow with home health  REVIEW OF SYSTEMS:    Review of Systems  Constitutional: Negative for fever, chills weight loss HENT: Negative for ear pain, nosebleeds, congestion, facial swelling, rhinorrhea, neck pain, neck stiffness and ear discharge.   Respiratory: Negative for cough, shortness of breath, wheezing  Cardiovascular: Negative for chest pain, palpitations and leg swelling.  Gastrointestinal: Negative for heartburn, abdominal pain, vomiting, diarrhea or consitpation Genitourinary: Negative for dysuria, urgency, frequency, hematuria Musculoskeletal: Negative for back pain or joint pain Neurological: Negative for dizziness, seizures, syncope, focal weakness,  numbness and headaches.  Hematological: Does not bruise/bleed easily.  Psychiatric/Behavioral: Negative for hallucinations, confusion, dysphoric mood    Tolerating Diet: yes      DRUG ALLERGIES:   Allergies  Allergen Reactions  . Ciprofloxacin Other (See Comments)    Photosensitivity  . Sulfa Antibiotics Rash    VITALS:  Blood pressure 130/74, pulse 94, temperature 98.7 F (37.1 C), temperature source Oral, resp. rate 19, height 5\' 7"  (1.702 m), weight 83.9 kg (185 lb), SpO2 97 %.  PHYSICAL EXAMINATION:  Constitutional: Appears well-developed and well-nourished. No distress. HENT: Normocephalic. Marland Kitchen Oropharynx is clear and moist.  Eyes: Conjunctivae and EOM are normal. PERRLA, no scleral icterus.  Neck: Normal ROM. Neck supple. No JVD. No tracheal deviation. CVS: RRR, S1/S2 +, no murmurs, no gallops, no carotid bruit.  Pulmonary: Effort and breath sounds normal, no stridor, rhonchi, wheezes, rales.  Abdominal: Soft. BS +,  no distension, tenderness, rebound or guarding.  Musculoskeletal: Normal range of  motion. No edema and no tenderness.  Neuro: Alert. CN 2-12 grossly intact. No focal deficits. Skin: Skin is warm and dry. No rash noted. Psychiatric: Normal mood and affect.      LABORATORY PANEL:   CBC Recent Labs  Lab 10/20/17 0435  WBC 10.0  HGB 13.8  HCT 40.3  PLT 149*   ------------------------------------------------------------------------------------------------------------------  Chemistries  Recent Labs  Lab 10/19/17 0419 10/20/17 0435  NA 137 136  K 3.6 4.2  CL 103 105  CO2 22 24  GLUCOSE 104* 172*  BUN 13 11  CREATININE 1.08 0.67  CALCIUM 8.8* 8.5*  AST 29  --   ALT 21  --   ALKPHOS 46  --   BILITOT 1.2  --    ------------------------------------------------------------------------------------------------------------------  Cardiac Enzymes Recent Labs  Lab 10/19/17 0419  TROPONINI <0.03   ------------------------------------------------------------------------------------------------------------------  RADIOLOGY:  Dg Chest Portable 1 View  Result Date: 10/19/2017 CLINICAL DATA:  71 year old male with fall. EXAM: PORTABLE CHEST 1 VIEW COMPARISON:  None FINDINGS: The lungs are clear. There is no pleural effusion or pneumothorax. The cardiac silhouette is within normal limits. No acute osseous pathology. IMPRESSION: No acute cardiopulmonary process. Electronically Signed   By: Anner Crete M.D.   On: 10/19/2017 05:16   Dg Hip Operative Unilat W Or W/o Pelvis Left  Result Date: 10/19/2017 CLINICAL DATA:  LEFT hip replacement EXAM: OPERATIVE LEFT HIP (WITH PELVIS IF PERFORMED) 1 VIEWS TECHNIQUE: Fluoroscopic spot image(s) were submitted for interpretation post-operatively. COMPARISON:  Preoperative exam of 10/19/2017 FLUOROSCOPY TIME:  0 minutes 30 seconds Dose:  6.1 mGy FINDINGS: Single image of the LEFT hip obtained intraoperatively. LEFT hip prosthesis without dislocation. Distal aspect of the femoral component is not imaged. IMPRESSION: Interval  resection  of the LEFT femoral head and neck and placement of a LEFT hip prosthesis. Electronically Signed   By: Lavonia Dana M.D.   On: 10/19/2017 22:15   Dg Hip Unilat W Or W/o Pelvis 2-3 Views Left  Result Date: 10/19/2017 CLINICAL DATA:  Post LEFT hip arthroplasty EXAM: DG HIP (WITH OR WITHOUT PELVIS) 2-3V LEFT COMPARISON:  Portable exam 2115 hours compared to intraoperative images of 10/19/2017 FINDINGS: Osseous demineralization. LEFT hip prosthesis without fracture-dislocation. Skin clips and soft tissue swelling truly at the LEFT hip region. SI joints and RIGHT hip joint space preserved. IMPRESSION: LEFT hip prosthesis without acute complication. Electronically Signed   By: Lavonia Dana M.D.   On: 10/19/2017 22:13   Dg Hip Unilat With Pelvis 2-3 Views Left  Result Date: 10/19/2017 CLINICAL DATA:  71 year old male with fall and left hip pain. EXAM: DG HIP (WITH OR WITHOUT PELVIS) 2-3V LEFT COMPARISON:  None. FINDINGS: There is a fracture of the left femoral neck. There is proximal migration and impaction of the femur. The left femoral head remains in anatomic alignment with the acetabulum. There is no dislocation. The bones are osteopenic. There is degenerative changes of the visualized lower lumbar spine. The soft tissues appear unremarkable. IMPRESSION: Fracture of the left femoral neck.  No dislocation. Electronically Signed   By: Anner Crete M.D.   On: 10/19/2017 05:14     ASSESSMENT AND PLAN:    71 year old male with BPH who presented after mechanical fall and was found to have a left femoral neck fracture.  1. Left femoral neck fracture: Patient is postoperative day #1 Plan for home health tomorrow  2. Depression: Continue Celexa  3. BPH: Continue Cardura  4. GERD: Continue PPI  Management plans discussed with the patient and he is in agreement.  CODE STATUS: full  TOTAL TIME TAKING CARE OF THIS PATIENT: 26 minutes.     POSSIBLE D/C tomorrow, DEPENDING ON CLINICAL  CONDITION.   Roben Tatsch M.D on 10/20/2017 at 11:22 AM  Between 7am to 6pm - Pager - [phone] After 6pm go to [email or web address] - password [email or web address] [facility]  Office  [phone]  CC: Primary care physician; Madelyn Brunner, MD  Note: This dictation was prepared with Dragon dictation along with smaller phrase technology. Any transcriptional errors that result from this process are unintentional.

## 2017-10-20 NOTE — Progress Notes (Deleted)
   Subjective: 1 Day Post-Op Procedure(s) (LRB): ANTERIOR APPROACH HEMI HIP ARTHROPLASTY (Left) Patient reports pain as 8 on 0-10 scale.  Or patient is asleep Patient is well, and has had no acute complaints or problems Patient did well with physical therapy yesterday but still needs to do the complete lap around the nurse's desk and stairs.  Plan is to go Home after hospital stay. no nausea and no vomiting Patient denies any chest pains or shortness of breath. No new complaints other than that he feels like he is catching a cold. Objective: Vital signs in last 24 hours: Temp:  [97.5 F (36.4 C)-99.1 F (37.3 C)] 97.7 F (36.5 C) (12/05 0301) Pulse Rate:  [72-101] 84 (12/05 0301) Resp:  [10-25] 19 (12/04 2325) BP: (129-153)/(69-98) 129/69 (12/05 0301) SpO2:  [87 %-100 %] 95 % (12/05 0301) Weight:  [83.9 kg (185 lb)] 83.9 kg (185 lb) (12/05 0344) well approximated incision Heels are non tender and elevated off the bed using rolled towels Intake/Output from previous day: 12/04 0701 - 12/05 0700 In: 2843.3 [P.O.:240; I.V.:2603.3] Out: 730 [Urine:380; Blood:350] Intake/Output this shift: Total I/O In: 2131.7 [P.O.:240; I.V.:1891.7] Out: 530 [Urine:180; Blood:350]  Recent Labs    10/19/17 0419 10/20/17 0435  HGB 14.6 13.8   Recent Labs    10/19/17 0419 10/20/17 0435  WBC 7.6 10.0  RBC 4.30* 4.06*  HCT 42.7 40.3  PLT 155 149*   Recent Labs    10/19/17 0419 10/20/17 0435  NA 137 136  K 3.6 4.2  CL 103 105  CO2 22 24  BUN 13 11  CREATININE 1.08 0.67  GLUCOSE 104* 172*  CALCIUM 8.8* 8.5*   No results for input(s): LABPT, INR in the last 72 hours.  EXAM General - Patient is Alert, Appropriate and Oriented Extremity - Neurologically intact Neurovascular intact Sensation intact distally Intact pulses distally Dorsiflexion/Plantar flexion intact No cellulitis present Compartment soft Dressing - dressing C/D/I Motor Function - intact, moving foot and toes  well on exam.    Past Medical History:  Diagnosis Date  . Bony exostosis 02/05/2016  . Capsulitis of left foot 02/05/2016  . Enlarged prostate with lower urinary tract symptoms (LUTS) 09/01/2012  . Essential hypertension 02/08/2017  . GERD (gastroesophageal reflux disease)   . 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 IBS (irritable bowel syndrome) 12/18/2014  . MVP (mitral valve prolapse) 10/19/2016  . Primary osteoarthritis of left knee 03/17/2017    Assessment/Plan: 1 Day Post-Op Procedure(s) (LRB): ANTERIOR APPROACH HEMI HIP ARTHROPLASTY (Left) Active Problems:   Hip fracture (HCC)  Estimated body mass index is 28.98 kg/m as calculated from the following:   Height as of this encounter: 5\' 7"  (1.702 m).   Weight as of this encounter: 83.9 kg (185 lb). Up with therapy Discharge home with home health  Labs: Were reviewed hemoglobin 9.2 which is up DVT Prophylaxis - Lovenox, Foot Pumps and TED hose Weight-Bearing as tolerated to left leg Hemovac was discontinued on today's visit. Tips of the Hemovac were visualized and appeared to be intact. Please wash the operative leg and apply TED stockings. Patient needs a bowel movement prior to being discharged Please give the patient 2 extra honeycomb dressings to take home  [place]. [place] 10/20/2017, 7:00 AM

## 2017-10-20 NOTE — Progress Notes (Signed)
   Subjective: 1 Day Post-Op Procedure(s) (LRB): ANTERIOR APPROACH HEMI HIP ARTHROPLASTY (Left) Patient reports pain as 0 on 0-10 scale.   Patient is well, and has had no acute complaints or problems Denies any CP, SOB, ABD pain. We will continue therapy today.    Objective: Vital signs in last 24 hours: Temp:  [97.5 F (36.4 C)-98.5 F (36.9 C)] 97.7 F (36.5 C) (12/05 0301) Pulse Rate:  [72-101] 84 (12/05 0301) Resp:  [10-25] 19 (12/04 2325) BP: (129-153)/(69-98) 129/69 (12/05 0301) SpO2:  [87 %-100 %] 95 % (12/05 0301) Weight:  [83.9 kg (185 lb)] 83.9 kg (185 lb) (12/05 0344)  Intake/Output from previous day: 12/04 0701 - 12/05 0700 In: 2843.3 [P.O.:240; I.V.:2603.3] Out: 830 [Urine:480; Blood:350] Intake/Output this shift: No intake/output data recorded.  Recent Labs    10/19/17 0419 10/20/17 0435  HGB 14.6 13.8   Recent Labs    10/19/17 0419 10/20/17 0435  WBC 7.6 10.0  RBC 4.30* 4.06*  HCT 42.7 40.3  PLT 155 149*   Recent Labs    10/19/17 0419 10/20/17 0435  NA 137 136  K 3.6 4.2  CL 103 105  CO2 22 24  BUN 13 11  CREATININE 1.08 0.67  GLUCOSE 104* 172*  CALCIUM 8.8* 8.5*   No results for input(s): LABPT, INR in the last 72 hours.  EXAM General - Patient is Alert, Appropriate and Oriented Extremity - Neurovascular intact Sensation intact distally Intact pulses distally Dorsiflexion/Plantar flexion intact No cellulitis present Compartment soft Dressing - dressing C/D/I and no drainage Motor Function - intact, moving foot and toes well on exam.   Past Medical History:  Diagnosis Date  . Bony exostosis 02/05/2016  . Capsulitis of left foot 02/05/2016  . Enlarged prostate with lower urinary tract symptoms (LUTS) 09/01/2012  . Essential hypertension 02/08/2017  . GERD (gastroesophageal reflux disease)   . 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 IBS (irritable bowel syndrome) 12/18/2014  . MVP (mitral valve prolapse) 10/19/2016  . Primary osteoarthritis of  left knee 03/17/2017    Assessment/Plan:   1 Day Post-Op Procedure(s) (LRB): ANTERIOR APPROACH HEMI HIP ARTHROPLASTY (Left) Active Problems:   Hip fracture (HCC)  Estimated body mass index is 28.98 kg/m as calculated from the following:   Height as of this encounter: 5\' 7"  (1.702 m).   Weight as of this encounter: 83.9 kg (185 lb). Advance diet Up with therapy  Needs BM Recheck labs in the am Hgb and VS stable CM to assist with discharge to home with HHPT  DVT Prophylaxis - Lovenox, Foot Pumps and TED hose Weight-Bearing as tolerated to left leg   T. Rachelle Hora, PA-C [place] 10/20/2017, 8:06 AM

## 2017-10-20 NOTE — Progress Notes (Signed)
Physical Therapy Treatment Patient Details Name: Roger Watson MRN: [id number] DOB: [birth year] Today's Date: 10/20/2017    History of Present Illness Pt is a 71 yo M with past medical history of mitral valve prolapse, arthritis, IBS and hypertension presents to the emergency department after a mechanical fall. He denies chest pain, lightheadedness or shortness of breath. The patient immediately felt pain in his left hip. X-ray of the hip showed a femoral neck fracture. Orthopedic surgery was consulted and the emergency department staff called the hospitalist service for admission.  Pt is now s/p L hip hemiarthroplasty with anterior approach.    PT Comments    Pt presents with deficits in strength, transfers, mobility, gait, balance, and activity tolerance but is progressing well with therapy.  Pt mod I with bed mobility with minimally increased time and effort with tasks and SBA with transfers with min verbal cues for sequencing.  Pt ambulated with good stability without LOB with improved posture.  Pt's HR was 91 bpm at rest and increased to 110 bpm after amb without adverse symptoms.  SpO2 95-96% throughout session on room air.  Pt will require stair training next session in preparation for discharge home but reports may be staying at girlfriend's house where there are no steps for entry.  Pt will benefit from HHPT services upon discharge to safely address above deficits for decreased caregiver assistance and eventual return to PLOF.    Follow Up Recommendations  Home health PT     Equipment Recommendations  Rolling walker with 5" wheels    Recommendations for Other Services       Precautions / Restrictions Precautions Precautions: Fall;Anterior Hip Precaution Booklet Issued: Yes (comment) Restrictions Weight Bearing Restrictions: Yes LLE Weight Bearing: Weight bearing as tolerated    Mobility  Bed Mobility Overal bed mobility: Modified Independent              General bed mobility comments: Use of bed rail and increased time and effort but no physical assistance required with sup to/from sit  Transfers Overall transfer level: Needs assistance Equipment used: Rolling walker (2 wheeled) Transfers: Sit to/from Stand Sit to Stand: Supervision         General transfer comment: Good effort and control during sit to/from stand from EOB with good stability upon initial stand  Ambulation/Gait Ambulation/Gait assistance: Supervision Ambulation Distance (Feet): 125 Feet x 1, 75' x 1 Assistive device: Rolling walker (2 wheeled) Gait Pattern/deviations: Step-through pattern;Decreased step length - right;Decreased step length - left;Trunk flexed   Gait velocity interpretation: Below normal speed for age/gender General Gait Details: Pt steady with gait with verbal cues for amb closer to RW with decreased UE WB   Stairs            Wheelchair Mobility    Modified Rankin (Stroke Patients Only)       Balance Overall balance assessment: Needs assistance Sitting-balance support: Feet unsupported;Feet supported;No upper extremity supported Sitting balance-Leahy Scale: Normal     Standing balance support: Bilateral upper extremity supported Standing balance-Leahy Scale: Good                              Cognition Arousal/Alertness: Awake/alert Behavior During Therapy: WFL for tasks assessed/performed Overall Cognitive Status: Within Functional Limits for tasks assessed  Exercises Total Joint Exercises Ankle Circles/Pumps: AROM;Both;10 reps Quad Sets: Strengthening;Both;10 reps Gluteal Sets: Strengthening;Both;10 reps Towel Squeeze: Strengthening;Both;10 reps Hip ABduction/ADduction: AROM;Left;5 reps Long Arc Quad: AROM;Both;10 reps;5 reps Knee Flexion: AROM;Both;5 reps;10 reps Marching in Standing: AROM;Both;5 reps Other Exercises Other Exercises: HEP booklet  review    General Comments        Pertinent Vitals/Pain Pain Assessment: 0-10 Pain Score: 2  Pain Location: L hip Pain Descriptors / Indicators: Operative site guarding;Sore Pain Intervention(s): Premedicated before session;Monitored during session;Limited activity within patient's tolerance    Home Living                      Prior Function            PT Goals (current goals can now be found in the care plan section) Progress towards PT goals: Progressing toward goals    Frequency    BID      PT Plan Current plan remains appropriate    Co-evaluation              AM-PAC PT "6 Clicks" Daily Activity  Outcome Measure                   End of Session Equipment Utilized During Treatment: Gait belt Activity Tolerance: Patient tolerated treatment well Patient left: with call bell/phone within reach;in chair;with bed alarm set;with SCD's reapplied Nurse Communication: Mobility status PT Visit Diagnosis: Other abnormalities of gait and mobility (R26.89);Muscle weakness (generalized) (M62.81)     Time: 0254-2706 PT Time Calculation (min) (ACUTE ONLY): 25 min  Charges:  $Gait Training: 8-22 mins $Therapeutic Exercise: 8-22 mins                    G Codes:       DRoyetta Asal PT, DPT 10/20/17, 4:30 PM

## 2017-10-20 NOTE — Evaluation (Signed)
Physical Therapy Evaluation Patient Details Name: Roger Watson MRN: [id number] DOB: [birth year] Today's Date: 10/20/2017   History of Present Illness  Pt is a 71 yo M with past medical history of mitral valve prolapse, arthritis, IBS and hypertension presents to the emergency department after a mechanical fall. He denies chest pain, lightheadedness or shortness of breath. The patient immediately felt pain in his left hip. X-ray of the hip showed a femoral neck fracture. Orthopedic surgery was consulted and the emergency department staff called the hospitalist service for admission.  Pt is now s/p L hip hemiarthroplasty with anterior approach.    Clinical Impression  Pt presents with mild deficits in strength, transfers, mobility, gait, balance, and activity tolerance.  Pt required some increased time and effort with bed mobility and transfers but overall performed tasks very well.  Pt steady with gait with distance limited by this PT secondary to HR increase from 100 bpm to 122 bpm with O2 decreased from 97% to 92% after amb only 10', nursing notified.  Pt reported no adverse symptoms with activity and reports L hip pain as 0/10 at rest and "very little" with activity.  I anticipate pt to progress very well based on how he performed during this session as well as his very active prior level.  Pt will benefit from HHPT services upon discharge to safely address above deficits for decreased caregiver assistance and eventual return to PLOF.        Follow Up Recommendations Home health PT    Equipment Recommendations  Rolling walker with 5" wheels    Recommendations for Other Services       Precautions / Restrictions Precautions Precautions: Fall;Anterior Hip Precaution Booklet Issued: Yes (comment) Restrictions Weight Bearing Restrictions: Yes LLE Weight Bearing: Weight bearing as tolerated      Mobility  Bed Mobility Overal bed mobility: Modified Independent              General bed mobility comments: Use of bed rail and increased time and effort but no physical assistance required with sup to/from sit  Transfers Overall transfer level: Needs assistance Equipment used: Rolling walker (2 wheeled) Transfers: Sit to/from Stand Sit to Stand: Supervision         General transfer comment: Good effort and control during sit to/from stand from EOB with good stability upon initial stand  Ambulation/Gait Ambulation/Gait assistance: Min guard Ambulation Distance (Feet): 10 Feet Assistive device: Rolling walker (2 wheeled) Gait Pattern/deviations: Step-through pattern;Decreased step length - right;Decreased step length - left   Gait velocity interpretation: Below normal speed for age/gender General Gait Details: Pt steady with gait with distance limited by this PT secondary to HR increase from 100 bpm to 122 bpm with O2 decreased from 97% to 92%, nursing notified.   Stairs Stairs: (deferred)          Wheelchair Mobility    Modified Rankin (Stroke Patients Only)       Balance Overall balance assessment: Needs assistance Sitting-balance support: Feet unsupported;Feet supported;No upper extremity supported Sitting balance-Leahy Scale: Normal     Standing balance support: Bilateral upper extremity supported Standing balance-Leahy Scale: Good                               Pertinent Vitals/Pain Pain Assessment: No/denies pain    Home Living Family/patient expects to be discharged to:: Private residence Living Arrangements: Alone Available Help at Discharge: Friend(s)(Girlfriend who is a retired Marine scientist) Type  of Home: House Home Access: Stairs to enter Entrance Stairs-Rails: Right;Left;Can reach both Technical brewer of Steps: 3 Home Layout: One level Home Equipment: None      Prior Function Level of Independence: Independent         Comments: Very active, works as a Radio broadcast assistant and goes to [employer] 3x/wk, no other  fall history.  Current fall occured while going to the BR at 1:30 AM, tripped on counter and fell.     Hand Dominance   Dominant Hand: Right    Extremity/Trunk Assessment   Upper Extremity Assessment Upper Extremity Assessment: Overall WFL for tasks assessed    Lower Extremity Assessment Lower Extremity Assessment: Generalized weakness;LLE deficits/detail LLE Deficits / Details: Sensation to light touch intact, strong active ankle DF and PF LLE: Unable to fully assess due to pain       Communication   Communication: No difficulties  Cognition Arousal/Alertness: Awake/alert Behavior During Therapy: WFL for tasks assessed/performed Overall Cognitive Status: Within Functional Limits for tasks assessed                                        General Comments      Exercises Total Joint Exercises Ankle Circles/Pumps: AROM;Both;10 reps Quad Sets: Strengthening;Both;10 reps Gluteal Sets: Strengthening;Both;10 reps Hip ABduction/ADduction: AROM;Left;5 reps Straight Leg Raises: AROM;Left;5 reps Long Arc Quad: AROM;Both;10 reps Knee Flexion: AROM;Both;10 reps Marching in Standing: AROM;Both;5 reps Other Exercises Other Exercises: HEP booklet education and review Other Exercises: Principles of activity progression education provided   Assessment/Plan    PT Assessment Patient needs continued PT services  PT Problem List Decreased strength;Decreased activity tolerance;Decreased balance;Decreased mobility;Decreased knowledge of use of DME       PT Treatment Interventions DME instruction;Gait training;Stair training;Functional mobility training;Neuromuscular re-education;Therapeutic exercise;Therapeutic activities;Patient/family education    PT Goals (Current goals can be found in the Care Plan section)  Acute Rehab PT Goals Patient Stated Goal: To get stronger and back to the gym PT Goal Formulation: With patient Time For Goal Achievement: 11/02/17 Potential  to Achieve Goals: Good    Frequency BID   Barriers to discharge        Co-evaluation               AM-PAC PT "6 Clicks" Daily Activity  Outcome Measure Difficulty turning over in bed (including adjusting bedclothes, sheets and blankets)?: A Little Difficulty moving from lying on back to sitting on the side of the bed? : A Little Difficulty sitting down on and standing up from a chair with arms (e.g., wheelchair, bedside commode, etc,.)?: A Little Help needed moving to and from a bed to chair (including a wheelchair)?: A Little Help needed walking in hospital room?: A Little Help needed climbing 3-5 steps with a railing? : A Little 6 Click Score: 18    End of Session Equipment Utilized During Treatment: Gait belt Activity Tolerance: Patient tolerated treatment well Patient left: in bed;with call bell/phone within reach;with bed alarm set Nurse Communication: Mobility status;Other (comment)(Vital sign response to activity) PT Visit Diagnosis: Other abnormalities of gait and mobility (R26.89);Muscle weakness (generalized) (M62.81)    Time: 3976-7341 PT Time Calculation (min) (ACUTE ONLY): 51 min   Charges:   PT Evaluation $PT Eval Low Complexity: 1 Low PT Treatments $Therapeutic Exercise: 8-22 mins $Therapeutic Activity: 8-22 mins   PT G Codes:   PT G-Codes **NOT FOR INPATIENT CLASS**  Functional Assessment Tool Used: AM-PAC 6 Clicks Basic Mobility Functional Limitation: Mobility: Walking and moving around Mobility: Walking and Moving Around Current Status [phone]): At least 40 percent but less than 60 percent impaired, limited or restricted Mobility: Walking and Moving Around Goal Status [phone]): At least 1 percent but less than 20 percent impaired, limited or restricted    D. Royetta Asal PT, DPT 10/20/17, 10:37 AM

## 2017-10-20 NOTE — Progress Notes (Signed)
Pt's pain has been controlled today with scheduled tylenol and PO oxy. He ambulated with RN assist to BR and had small BM. VSS.   [place], Roger Watson

## 2017-10-20 NOTE — Care Management Note (Addendum)
Case Management Note  Patient Details  Name: Roger Watson MRN: [id number] Date of Birth: 07/10/1946  Subjective/Objective:  POD # 1 left hemi hip arthroplasty. Met with patient. He plans to stay with his girlfriend [address]  [place] [address] Offered choice of home health agencies. [facility]erral to [facility] for [facility]. Will need a walker and bsc,  ordered from [facility]. Pharmacy: [facility] [phone]. Called Lovenox 40 mg # 14 no refills for price.                   Action/Plan: [facility] for [facility]PT, Walker from [facility]. Lovenox price pending  Expected Discharge Date:  10/22/17               Expected Discharge Plan:  [place]  In-House Referral:     Discharge planning Services  CM Consult  Post Acute Care Choice:  Durable Medical Equipment, Home Health Choice offered to:  Patient  DME Arranged:  Walker rolling, Bedside commode.  DME Agency:  [facility] Arranged:  [facility] [place] Agency:  [facility]  Status of Service:  In process, will continue to follow  If discussed at Long Length of Stay Meetings, dates discussed:    Additional Comments:  Jolly Mango, RN 10/20/2017, 9:58 AM

## 2017-10-20 NOTE — Care Management (Signed)
Cost of Lovenox is $64.00. Patient updated.

## 2017-10-20 NOTE — Discharge Instructions (Signed)
ANTERIOR APPROACH HEMI HIP REPLACEMENT POSTOPERATIVE DIRECTIONS   Hip Rehabilitation, Guidelines Following Surgery  The results of a hip operation are greatly improved after range of motion and muscle strengthening exercises. Follow all safety measures which are given to protect your hip. If any of these exercises cause increased pain or swelling in your joint, decrease the amount until you are comfortable again. Then slowly increase the exercises. Call your caregiver if you have problems or questions.   HOME CARE INSTRUCTIONS  Remove items at home which could result in a fall. This includes throw rugs or furniture in walking pathways.   ICE to the affected hip every three hours for 30 minutes at a time and then as needed for pain and swelling.  Continue to use ice on the hip for pain and swelling from surgery. You may notice swelling that will progress down to the foot and ankle.  This is normal after surgery.  Elevate the leg when you are not up walking on it.    Continue to use the breathing machine which will help keep your temperature down.  It is common for your temperature to cycle up and down following surgery, especially at night when you are not up moving around and exerting yourself.  The breathing machine keeps your lungs expanded and your temperature down.  Do not place pillow under knee, focus on keeping the knee straight while resting  DIET You may resume your previous home diet once your are discharged from the hospital.  DRESSING / WOUND CARE / SHOWERING Keep dressing clean and dry. Change dressing only as needed. You may shower after your first follow up appointment with [facility].  ACTIVITY Walk with your walker as instructed. Use walker as long as suggested by your caregivers. Avoid periods of inactivity such as sitting longer than an hour when not asleep. This helps prevent blood clots.  You may resume a sexual relationship in one month or when given the OK  by your doctor.  You may return to work once you are cleared by your doctor.  Do not drive a car for 6 weeks or until released by you surgeon.  Do not drive while taking narcotics.  WEIGHT BEARING Weight bearing as tolerated. Use walker/cane as needed for at least 4 weeks post op.  POSTOPERATIVE CONSTIPATION PROTOCOL Constipation - defined medically as fewer than three stools per week and severe constipation as less than one stool per week.  One of the most common issues patients have following surgery is constipation.  Even if you have a regular bowel pattern at home, your normal regimen is likely to be disrupted due to multiple reasons following surgery.  Combination of anesthesia, postoperative narcotics, change in appetite and fluid intake all can affect your bowels.  In order to avoid complications following surgery, here are some recommendations in order to help you during your recovery period.  Colace (docusate) - Pick up an over-the-counter form of Colace or another stool softener and take twice a day as long as you are requiring postoperative pain medications.  Take with a full glass of water daily.  If you experience loose stools or diarrhea, hold the colace until you stool forms back up.  If your symptoms do not get better within 1 week or if they get worse, check with your doctor.  Dulcolax (bisacodyl) - Pick up over-the-counter and take as directed by the product packaging as needed to assist with the movement of your bowels.  Take with a  full glass of water.  Use this product as needed if not relieved by Colace only.  ° °MiraLax (polyethylene glycol) - Pick up over-the-counter to have on hand.  MiraLax is a solution that will increase the amount of water in your bowels to assist with bowel movements.  Take as directed and can mix with a glass of water, juice, soda, coffee, or tea.  Take if you go more than two days without a movement. °Do not use MiraLax more than once per day. Call your  doctor if you are still constipated or irregular after using this medication for 7 days in a row. ° °If you continue to have problems with postoperative constipation, please contact the office for further assistance and recommendations.  If you experience "the worst abdominal pain ever" or develop nausea or vomiting, please contact the office immediatly for further recommendations for treatment. ° °ITCHING ° If you experience itching with your medications, try taking only a single pain pill, or even half a pain pill at a time.  You can also use Benadryl over the counter for itching or also to help with sleep.  ° °TED HOSE STOCKINGS °Wear the elastic stockings on both legs for six weeks following surgery during the day but you may remove then at night for sleeping. ° °MEDICATIONS °See your medication summary on the “After Visit Summary” that the nursing staff will review with you prior to discharge.  You may have some home medications which will be placed on hold until you complete the course of blood thinner medication.  It is important for you to complete the blood thinner medication as prescribed by your surgeon.  Continue your approved medications as instructed at time of discharge. ° °PRECAUTIONS °If you experience chest pain or shortness of breath - call 911 immediately for transfer to the hospital emergency department.  °If you develop a fever greater that 101 F, purulent drainage from wound, increased redness or drainage from wound, foul odor from the wound/dressing, or calf pain - CONTACT YOUR SURGEON.   °                                                °FOLLOW-UP APPOINTMENTS °Make sure you keep all of your appointments after your operation with your surgeon and caregivers. You should call the office at the above phone number and make an appointment for approximately two weeks after the date of your surgery or on the date instructed by your surgeon outlined in the "After Visit Summary". ° °RANGE OF MOTION  AND STRENGTHENING EXERCISES  °These exercises are designed to help you keep full movement of your hip joint. Follow your caregiver's or physical therapist's instructions. Perform all exercises about fifteen times, three times per day or as directed. Exercise both hips, even if you have had only one joint replacement. These exercises can be done on a training (exercise) mat, on the floor, on a table or on a bed. Use whatever works the best and is most comfortable for you. Use music or television while you are exercising so that the exercises are a pleasant break in your day. This will make your life better with the exercises acting as a break in routine you can look forward to.  °Lying on your back, slowly slide your foot toward your buttocks, raising your knee up off the floor. Then   slowly slide your foot back down until your leg is straight again.  °Lying on your back spread your legs as far apart as you can without causing discomfort.  °Lying on your side, raise your upper leg and foot straight up from the floor as far as is comfortable. Slowly lower the leg and repeat.  °Lying on your back, tighten up the muscle in the front of your thigh (quadriceps muscles). You can do this by keeping your leg straight and trying to raise your heel off the floor. This helps strengthen the largest muscle supporting your knee.  °Lying on your back, tighten up the muscles of your buttocks both with the legs straight and with the knee bent at a comfortable angle while keeping your heel on the floor.  ° °IF YOU ARE TRANSFERRED TO A SKILLED REHAB FACILITY °If the patient is transferred to a skilled rehab facility following release from the hospital, a list of the current medications will be sent to the facility for the patient to continue.  When discharged from the skilled rehab facility, please have the facility set up the patient's Home Health Physical Therapy prior to being released. Also, the skilled facility will be responsible  for providing the patient with their medications at time of release from the facility to include their pain medication, the muscle relaxants, and their blood thinner medication. If the patient is still at the rehab facility at time of the two week follow up appointment, the skilled rehab facility will also need to assist the patient in arranging follow up appointment in our office and any transportation needs. ° °MAKE SURE YOU:  °Understand these instructions.  °Get help right away if you are not doing well or get worse.  ° ° °Pick up stool softner and laxative for home use following surgery while on pain medications. °Continue to use ice for pain and swelling after surgery. °Do not use any lotions or creams on the incision until instructed by your surgeon. ° °

## 2017-10-21 LAB — CBC
HCT: 36.8 % — ABNORMAL LOW (ref 40.0–52.0)
Hemoglobin: 12.3 g/dL — ABNORMAL LOW (ref 13.0–18.0)
MCH: 33.3 pg (ref 26.0–34.0)
MCHC: 33.4 g/dL (ref 32.0–36.0)
MCV: 99.7 fL (ref 80.0–100.0)
PLATELETS: 137 10*3/uL — AB (ref 150–440)
RBC: 3.69 MIL/uL — ABNORMAL LOW (ref 4.40–5.90)
RDW: 12.6 % (ref 11.5–14.5)
WBC: 10.8 10*3/uL — AB (ref 3.8–10.6)

## 2017-10-21 MED ORDER — OXYCODONE HCL 5 MG PO TABS: 5.0000 mg | ORAL_TABLET | ORAL | 0 refills | Status: DC | PRN

## 2017-10-21 MED ORDER — DOCUSATE SODIUM 100 MG PO CAPS: 100.0000 mg | ORAL_CAPSULE | Freq: Two times a day (BID) | ORAL | 0 refills | Status: DC

## 2017-10-21 MED ORDER — ENOXAPARIN SODIUM 40 MG/0.4ML ~~LOC~~ SOLN: 40.0000 mg | SUBCUTANEOUS | 0 refills | Status: DC

## 2017-10-21 NOTE — Progress Notes (Signed)
Pt ready for d/c home today per MD. Pt met PT goals, walker and 3N1 was delivered to room. Discharge instructions and prescriptions reviewed with pt and his significant other, all questions answered. Lovenox teaching done. Pt's belongings packed, PIV removed. Pt assisted to car via NT.   [place], Jerry Caras

## 2017-10-21 NOTE — Progress Notes (Signed)
   Subjective: 2 Days Post-Op Procedure(s) (LRB): ANTERIOR APPROACH HEMI HIP ARTHROPLASTY (Left) Patient reports pain as mild.   Patient is well, and has had no acute complaints or problems. Low grade fever of 99. No cough. Denies any CP, SOB, ABD pain. We will continue therapy today.    Objective: Vital signs in last 24 hours: Temp:  [98.4 F (36.9 C)-99 F (37.2 C)] 99 F (37.2 C) (12/06 0737) Pulse Rate:  [74-94] 74 (12/06 0737) Resp:  [18-19] 18 (12/06 0737) BP: (118-138)/(65-81) 138/81 (12/06 0737) SpO2:  [92 %-97 %] 96 % (12/06 0737) Weight:  [83.9 kg (185 lb)] 83.9 kg (185 lb) (12/06 0645)  Intake/Output from previous day: 12/05 0701 - 12/06 0700 In: 1968.3 [P.O.:600; I.V.:1318.3; IV Piggyback:50] Out: 690 [Urine:690] Intake/Output this shift: No intake/output data recorded.  Recent Labs    10/19/17 0419 10/20/17 0435 10/21/17 0255  HGB 14.6 13.8 12.3*   Recent Labs    10/20/17 0435 10/21/17 0255  WBC 10.0 10.8*  RBC 4.06* 3.69*  HCT 40.3 36.8*  PLT 149* 137*   Recent Labs    10/19/17 0419 10/20/17 0435  NA 137 136  K 3.6 4.2  CL 103 105  CO2 22 24  BUN 13 11  CREATININE 1.08 0.67  GLUCOSE 104* 172*  CALCIUM 8.8* 8.5*   No results for input(s): LABPT, INR in the last 72 hours.  EXAM General - Patient is Alert, Appropriate and Oriented Extremity - Neurovascular intact Sensation intact distally Intact pulses distally Dorsiflexion/Plantar flexion intact No cellulitis present Compartment soft Dressing - dressing C/D/I and scant drainage. New dressing applied Motor Function - intact, moving foot and toes well on exam.   Past Medical History:  Diagnosis Date  . Bony exostosis 02/05/2016  . Capsulitis of left foot 02/05/2016  . Enlarged prostate with lower urinary tract symptoms (LUTS) 09/01/2012  . Essential hypertension 02/08/2017  . GERD (gastroesophageal reflux disease)   . 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 IBS (irritable bowel syndrome) 12/18/2014   . MVP (mitral valve prolapse) 10/19/2016  . Primary osteoarthritis of left knee 03/17/2017    Assessment/Plan:   2 Days Post-Op Procedure(s) (LRB): ANTERIOR APPROACH HEMI HIP ARTHROPLASTY (Left) Active Problems:   Hip fracture (HCC)  Estimated body mass index is 28.98 kg/m as calculated from the following:   Height as of this encounter: 5\' 7"  (1.702 m).   Weight as of this encounter: 83.9 kg (185 lb). [facility] diet Up with therapy  Discharge home with HHPT today Follow up with [facility] in 2 weeks TED HOSE BLE x 6 weeks Lovenox 40 mg daily times 14 days Encouraged incentive spirometer   DVT Prophylaxis - Lovenox, Foot Pumps and TED hose Weight-Bearing as tolerated to left leg   T. Rachelle Hora, PA-C [place] 10/21/2017, 8:05 AM

## 2017-10-21 NOTE — Discharge Summary (Signed)
Roger Watson at [place] NAME: Roger Watson    MR#:  [id number]  DATE OF BIRTH:  08/22/1946  DATE OF ADMISSION:  10/19/2017 ADMITTING PHYSICIAN: Harrie Foreman, MD  DATE OF DISCHARGE: 10/21/2017  PRIMARY CARE PHYSICIAN: Madelyn Brunner, MD    ADMISSION DIAGNOSIS:  Capsulitis of left foot [M77.52] Fall, initial encounter [W19.XXXA] Closed fracture of neck of left femur, initial encounter ([place]) [S72.002A] Capsulitis of metatarsophalangeal (MTP) joint of left foot [M77.52]  DISCHARGE DIAGNOSIS:  Active Problems:   Hip fracture ([place])   SECONDARY DIAGNOSIS:   Past Medical History:  Diagnosis Date  . Bony exostosis 02/05/2016  . Capsulitis of left foot 02/05/2016  . Enlarged prostate with lower urinary tract symptoms (LUTS) 09/01/2012  . Essential hypertension 02/08/2017  . GERD (gastroesophageal reflux disease)   . 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 IBS (irritable bowel syndrome) 12/18/2014  . MVP (mitral valve prolapse) 10/19/2016  . Primary osteoarthritis of left knee 03/17/2017    HOSPITAL COURSE:   71 year old male with a history of BPH who presented after mechanical fall and was found to have a left hip fracture.  1. Left femoral neck fracture: Patient is postoperative day #2 Patient is doing well. He will go home with home health. He will have outpatient follow-up with orthopedic surgery within 7-10 days. He will continue Lovenox for DVT prophylaxis for total 14 days.  2. Depression: Continue Celexa  3. BPH: Continue Cardura  4. GERD: Continue PPI   DISCHARGE CONDITIONS AND DIET:   Stable for discharge are regular diet  CONSULTS OBTAINED:  Treatment Team:  Hessie Knows, MD  DRUG ALLERGIES:   Allergies  Allergen Reactions  . Ciprofloxacin Other (See Comments)    Photosensitivity  . Sulfa Antibiotics Rash    DISCHARGE MEDICATIONS:   Allergies as of 10/21/2017      Reactions   Ciprofloxacin Other (See Comments)    Photosensitivity   Sulfa Antibiotics Rash      Medication List    STOP taking these medications   doxycycline 100 MG tablet Commonly known as:  VIBRA-TABS   gentamicin cream 0.1 % Commonly known as:  GARAMYCIN   indomethacin 25 MG capsule Commonly known as:  INDOCIN     TAKE these medications   citalopram 20 MG tablet Commonly known as:  CELEXA Take 20 mg by mouth daily.   docusate sodium 100 MG capsule Commonly known as:  COLACE Take 1 capsule (100 mg total) by mouth 2 (two) times daily.   doxazosin 2 MG tablet Commonly known as:  CARDURA Take 2 mg by mouth daily.   enoxaparin 40 MG/0.4ML injection Commonly known as:  LOVENOX Inject 0.4 mLs (40 mg total) into the skin daily for 14 days.   meloxicam 15 MG tablet Commonly known as:  MOBIC Take 15 mg by mouth daily as needed for pain.   oxyCODONE 5 MG immediate release tablet Commonly known as:  Oxy IR/ROXICODONE Take 1-2 tablets (5-10 mg total) by mouth every 4 (four) hours as needed for moderate pain or severe pain.   pantoprazole 40 MG tablet Commonly known as:  PROTONIX Take 40 mg by mouth daily.            Durable Medical Equipment  (From admission, onward)        Start     Ordered   10/19/17 2154  DME Walker rolling  Once    Question:  Patient needs a walker to treat with the  following condition  Answer:  Status post total hip replacement, left   10/19/17 2153   10/19/17 2154  DME 3 n 1  Once     10/19/17 2153   10/19/17 2154  DME Bedside commode  Once    Question:  Patient needs a bedside commode to treat with the following condition  Answer:  Status post total hip replacement, left   10/19/17 2153        Today   CHIEF COMPLAINT:  No acute issues overnight.   VITAL SIGNS:  Blood pressure 118/72, pulse 90, temperature 98.7 F (37.1 C), temperature source Oral, resp. rate 19, height 5\' 7"  (1.702 m), weight 83.9 kg (185 lb), SpO2 92 %.   REVIEW OF SYSTEMS:  Review of Systems   Constitutional: Negative.  Negative for chills, fever and malaise/fatigue.  HENT: Negative.  Negative for ear discharge, ear pain, hearing loss, nosebleeds and sore throat.   Eyes: Negative.  Negative for blurred vision and pain.  Respiratory: Negative.  Negative for cough, hemoptysis, shortness of breath and wheezing.   Cardiovascular: Negative.  Negative for chest pain, palpitations and leg swelling.  Gastrointestinal: Negative.  Negative for abdominal pain, blood in stool, diarrhea, nausea and vomiting.  Genitourinary: Negative.  Negative for dysuria.  Musculoskeletal: Negative.  Negative for back pain.  Skin: Negative.   Neurological: Negative for dizziness, tremors, speech change, focal weakness, seizures and headaches.  Endo/Heme/Allergies: Negative.  Does not bruise/bleed easily.  Psychiatric/Behavioral: Negative.  Negative for depression, hallucinations and suicidal ideas.     PHYSICAL EXAMINATION:  GENERAL:  71 y.o.-year-old patient lying in the bed with no acute distress.  NECK:  Supple, no jugular venous distention. No thyroid enlargement, no tenderness.  LUNGS: Normal breath sounds bilaterally, no wheezing, rales,rhonchi  No use of accessory muscles of respiration.  CARDIOVASCULAR: S1, S2 normal. No murmurs, rubs, or gallops.  ABDOMEN: Soft, non-tender, non-distended. Bowel sounds present. No organomegaly or mass.  EXTREMITIES: No pedal edema, cyanosis, or clubbing.  PSYCHIATRIC: The patient is alert and oriented x 3.  SKIN: left hip with honeycomb dressing    DATA REVIEW:   CBC Recent Labs  Lab 10/21/17 0255  WBC 10.8*  HGB 12.3*  HCT 36.8*  PLT 137*    Chemistries  Recent Labs  Lab 10/19/17 0419 10/20/17 0435  NA 137 136  K 3.6 4.2  CL 103 105  CO2 22 24  GLUCOSE 104* 172*  BUN 13 11  CREATININE 1.08 0.67  CALCIUM 8.8* 8.5*  AST 29  --   ALT 21  --   ALKPHOS 46  --   BILITOT 1.2  --     Cardiac Enzymes Recent Labs  Lab 10/19/17 0419   TROPONINI <0.03    Microbiology Results  @MICRORSLT48 @  RADIOLOGY:  Dg Hip Operative Unilat W Or W/o Pelvis Left  Result Date: 10/19/2017 CLINICAL DATA:  LEFT hip replacement EXAM: OPERATIVE LEFT HIP (WITH PELVIS IF PERFORMED) 1 VIEWS TECHNIQUE: Fluoroscopic spot image(s) were submitted for interpretation post-operatively. COMPARISON:  Preoperative exam of 10/19/2017 FLUOROSCOPY TIME:  0 minutes 30 seconds Dose:  6.1 mGy FINDINGS: Single image of the LEFT hip obtained intraoperatively. LEFT hip prosthesis without dislocation. Distal aspect of the femoral component is not imaged. IMPRESSION: Interval resection of the LEFT femoral head and neck and placement of a LEFT hip prosthesis. Electronically Signed   By: Lavonia Dana M.D.   On: 10/19/2017 22:15   Dg Hip Unilat W Or W/o Pelvis 2-3 Views Left  Result Date: 10/19/2017 CLINICAL DATA:  Post LEFT hip arthroplasty EXAM: DG HIP (WITH OR WITHOUT PELVIS) 2-3V LEFT COMPARISON:  Portable exam 2115 hours compared to intraoperative images of 10/19/2017 FINDINGS: Osseous demineralization. LEFT hip prosthesis without fracture-dislocation. Skin clips and soft tissue swelling truly at the LEFT hip region. SI joints and RIGHT hip joint space preserved. IMPRESSION: LEFT hip prosthesis without acute complication. Electronically Signed   By: Lavonia Dana M.D.   On: 10/19/2017 22:13      Allergies as of 10/21/2017      Reactions   Ciprofloxacin Other (See Comments)   Photosensitivity   Sulfa Antibiotics Rash      Medication List    STOP taking these medications   doxycycline 100 MG tablet Commonly known as:  VIBRA-TABS   gentamicin cream 0.1 % Commonly known as:  GARAMYCIN   indomethacin 25 MG capsule Commonly known as:  INDOCIN     TAKE these medications   citalopram 20 MG tablet Commonly known as:  CELEXA Take 20 mg by mouth daily.   docusate sodium 100 MG capsule Commonly known as:  COLACE Take 1 capsule (100 mg total) by mouth 2 (two)  times daily.   doxazosin 2 MG tablet Commonly known as:  CARDURA Take 2 mg by mouth daily.   enoxaparin 40 MG/0.4ML injection Commonly known as:  LOVENOX Inject 0.4 mLs (40 mg total) into the skin daily for 14 days.   meloxicam 15 MG tablet Commonly known as:  MOBIC Take 15 mg by mouth daily as needed for pain.   oxyCODONE 5 MG immediate release tablet Commonly known as:  Oxy IR/ROXICODONE Take 1-2 tablets (5-10 mg total) by mouth every 4 (four) hours as needed for moderate pain or severe pain.   pantoprazole 40 MG tablet Commonly known as:  PROTONIX Take 40 mg by mouth daily.            Durable Medical Equipment  (From admission, onward)        Start     Ordered   10/19/17 2154  DME Walker rolling  Once    Question:  Patient needs a walker to treat with the following condition  Answer:  Status post total hip replacement, left   10/19/17 2153   10/19/17 2154  DME 3 n 1  Once     10/19/17 2153   10/19/17 2154  DME Bedside commode  Once    Question:  Patient needs a bedside commode to treat with the following condition  Answer:  Status post total hip replacement, left   10/19/17 2153      Management plans discussed with the patient and he is in agreement. Stable for discharge home with home health  Patient should follow up with orthopedics in 1 week  CODE STATUS:     Code Status Orders  (From admission, onward)        Start     Ordered   10/19/17 0658  Full code  Continuous     10/19/17 0657    Code Status History    Date Active Date Inactive Code Status Order ID Comments User Context   This patient has a current code status but no historical code status.    Advance Directive Documentation     Most Recent Value  Type of Advance Directive  Healthcare Power of Attorney, Living will  Pre-existing out of facility DNR order (yellow form or pink MOST form)  No data  "MOST" Form in Place?  No  data      TOTAL TIME TAKING CARE OF THIS PATIENT: 38 minutes.     Note: This dictation was prepared with Dragon dictation along with smaller phrase technology. Any transcriptional errors that result from this process are unintentional.  Kavi Almquist M.D on 10/21/2017 at 7:19 AM  Between 7am to 6pm - Pager - [phone] After 6pm go to [email or web address] - password [email or web address] [facility]  Office  [phone]  CC: Primary care physician; Madelyn Brunner, MD

## 2017-10-21 NOTE — Care Management Note (Signed)
Case Management Note  Patient Details  Name: Roger Watson MRN: [id number] Date of Birth: 04-11-1946  Subjective/Objective:  Discharging today                   Action/Plan: Advanced notified of discharge with HHPT  Expected Discharge Date:  10/21/17               Expected Discharge Plan:  [place]  In-House Referral:     Discharge planning Services  CM Consult  Post Acute Care Choice:  Durable Medical Equipment, Home Health Choice offered to:  Patient  DME Arranged:  Walker rolling DME Agency:  [place] Arranged:  PT [facility] Agency:  [place]  Status of Service:  Completed, signed off  If discussed at [place] of Stay Meetings, dates discussed:    Additional Comments:  Jolly Mango, RN 10/21/2017, 8:28 AM

## 2017-10-21 NOTE — Progress Notes (Signed)
Physical Therapy Treatment Patient Details Name: Roger Watson MRN: [id number] DOB: 12/10/1945 Today's Date: 10/21/2017    History of Present Illness Pt is a 70 yo M with past medical history of mitral valve prolapse, arthritis, IBS and hypertension presents to the emergency department after a mechanical fall. He denies chest pain, lightheadedness or shortness of breath. The patient immediately felt pain in his left hip. X-ray of the hip showed a femoral neck fracture. Orthopedic surgery was consulted and the emergency department staff called the hospitalist service for admission.  Pt is now s/p L hip hemiarthroplasty with anterior approach.    PT Comments    Pt did very well with PT and was able to do bed mobility w/o assist, showed ability to negotiate up/down steps and after brief warm up was able to ambulate with consistent and smooth gait pattern with cuing and encouragement.  Pt did have some pain with hip extension to neutral but overall is doing well.    Follow Up Recommendations  Home health PT     Equipment Recommendations  Rolling walker with 5" wheels    Recommendations for Other Services       Precautions / Restrictions Precautions Precautions: Fall;Anterior Hip Precaution Booklet Issued: Yes (comment) Restrictions LLE Weight Bearing: Weight bearing as tolerated    Mobility  Bed Mobility Overal bed mobility: Modified Independent             General bed mobility comments: Slow to initiate movement, but ultimately able to get to sitting w/o assist  Transfers Overall transfer level: Modified independent Equipment used: Rolling walker (2 wheeled) Transfers: Sit to/from Stand Sit to Stand: Supervision         General transfer comment: Pt able to rise to standing w/o excessive effort, good safety  Ambulation/Gait Ambulation/Gait assistance: Supervision Ambulation Distance (Feet): 250 Feet Assistive device: Rolling walker (2 wheeled)        General Gait Details: Pt initially with slow, guarded gait, but after warming up was able to ambulate with consistent and confidence cadence   Stairs Stairs: Yes   Stair Management: Two rails;One rail Left Number of Stairs: 4 General stair comments: Pt needed only minimal cuing for appropriate strategy/sequencing  Wheelchair Mobility    Modified Rankin (Stroke Patients Only)       Balance Overall balance assessment: Modified Independent   Sitting balance-Leahy Scale: Normal       Standing balance-Leahy Scale: Good                              Cognition Arousal/Alertness: Awake/alert Behavior During Therapy: WFL for tasks assessed/performed Overall Cognitive Status: Within Functional Limits for tasks assessed                                        Exercises Total Joint Exercises Ankle Circles/Pumps: AROM;10 reps Quad Sets: Strengthening;10 reps Gluteal Sets: Strengthening;10 reps Short Arc Quad: Strengthening;10 reps Hip ABduction/ADduction: Strengthening;10 reps Straight Leg Raises: AAROM;10 reps Knee Flexion: AAROM;10 reps;PROM    General Comments        Pertinent Vitals/Pain Pain Assessment: 0-10 Pain Score: 2  Pain Location: L hip    Home Living                      Prior Function  PT Goals (current goals can now be found in the care plan section) Progress towards PT goals: Progressing toward goals    Frequency    BID      PT Plan Current plan remains appropriate    Co-evaluation              AM-PAC PT "6 Clicks" Daily Activity  Outcome Measure  Difficulty turning over in bed (including adjusting bedclothes, sheets and blankets)?: None Difficulty moving from lying on back to sitting on the side of the bed? : A Little Difficulty sitting down on and standing up from a chair with arms (e.g., wheelchair, bedside commode, etc,.)?: None Help needed moving to and from a bed to chair  (including a wheelchair)?: None Help needed walking in hospital room?: None Help needed climbing 3-5 steps with a railing? : A Little 6 Click Score: 22    End of Session Equipment Utilized During Treatment: Gait belt Activity Tolerance: Patient tolerated treatment well Patient left: with chair alarm set;with call bell/phone within reach   PT Visit Diagnosis: Other abnormalities of gait and mobility (R26.89);Muscle weakness (generalized) (M62.81)     Time: 5498-2641 PT Time Calculation (min) (ACUTE ONLY): 42 min  Charges:  $Gait Training: 23-37 mins $Therapeutic Exercise: 8-22 mins                    G Codes:       Kreg Shropshire, DPT 10/21/2017, 11:30 AM

## 2017-10-22 LAB — SURGICAL PATHOLOGY

## 2017-10-25 NOTE — Anesthesia Postprocedure Evaluation (Signed)
Anesthesia Post Note  Patient: Roger Watson  Procedure(s) Performed: ANTERIOR APPROACH HEMI HIP ARTHROPLASTY (Left )  Patient location during evaluation: PACU Anesthesia Type: General Level of consciousness: awake and alert Pain management: pain level controlled Vital Signs Assessment: post-procedure vital signs reviewed and stable Respiratory status: spontaneous breathing, nonlabored ventilation, respiratory function stable and patient connected to nasal cannula oxygen Cardiovascular status: blood pressure returned to baseline and stable Postop Assessment: no apparent nausea or vomiting Anesthetic complications: no     Last Vitals:  Vitals:   10/21/17 0737 10/21/17 1411  BP: 138/81 132/69  Pulse: 74 98  Resp: 18 18  Temp: 37.2 C 36.9 C  SpO2: 96% 96%    Last Pain:  Vitals:   10/21/17 1411  TempSrc: Oral  PainSc: 6                  Molli Barrows

## 2018-01-14 ENCOUNTER — Encounter: Payer: Self-pay | Admitting: Podiatry

## 2018-01-14 ENCOUNTER — Ambulatory Visit: Payer: Medicare Other | Admitting: Podiatry

## 2018-01-14 DIAGNOSIS — M7752 Other enthesopathy of left foot: Secondary | ICD-10-CM

## 2018-01-17 NOTE — Progress Notes (Signed)
   HPI: 72 year old male presenting today with a chief complaint of pain to the left foot. He reports an associated hammertoe of the second digit as well. Walking and bearing weight increase the pain while resting helps alleviate it. He has not done anything to treat the symptoms. Patient is here for further evaluation and treatment.   Past Medical History:  Diagnosis Date  . Bony exostosis 02/05/2016  . Capsulitis of left foot 02/05/2016  . Enlarged prostate with lower urinary tract symptoms (LUTS) 09/01/2012  . Essential hypertension 02/08/2017  . GERD (gastroesophageal reflux disease)   . 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 IBS (irritable bowel syndrome) 12/18/2014  . MVP (mitral valve prolapse) 10/19/2016  . Primary osteoarthritis of left knee 03/17/2017      Objective: Physical Exam General: The patient is alert and oriented x3 in no acute distress.  Dermatology: Skin is cool, dry and supple bilateral lower extremities. Negative for open lesions or macerations.  Vascular: Palpable pedal pulses bilaterally. No edema or erythema noted. Capillary refill within normal limits.  Neurological: Epicritic and protective threshold grossly intact bilaterally.   Musculoskeletal Exam: All pedal and ankle joints range of motion within normal limits bilateral. Muscle strength 5/5 in all groups bilateral. Hammertoe contracture deformity noted to the 2nd digit of the left foot. Pain with palpation of the 1st and 2nd MPJs of the left foot.    Assessment: - 1st MPJ capsulitis left - 2nd MPJ capsulitis left - hammertoe 2nd digit left   Plan of Care:  - Patient evaluated.  - Injection of 0.5 mLs Celestone Soluspan injected into the 1st MPJ of the left foot.  - Injection of 0.5 mLs Celestone Soluspan injected into the 2nd MPJ of the left foot.  - Recommended good shoe gear.  - Return to clinic as needed.   Has knee surgery in 3 weeks.     Edrick Kins, DPM [facility]  Dr. Edrick Kins,  DPM    [address], [place] [address]                Office [phone]  Fax [phone]

## 2018-02-09 ENCOUNTER — Other Ambulatory Visit
Admission: RE | Admit: 2018-02-09 | Discharge: 2018-02-09 | Disposition: A | Discharge status: Home / Self Care | Payer: Medicare Other | Source: Ambulatory Visit | Attending: Internal Medicine | Admitting: Internal Medicine

## 2018-02-09 DIAGNOSIS — R197 Diarrhea, unspecified: Secondary | ICD-10-CM | POA: Insufficient documentation

## 2018-02-09 LAB — GASTROINTESTINAL PANEL BY PCR, STOOL (REPLACES STOOL CULTURE)
ADENOVIRUS F40/41: NOT DETECTED
ASTROVIRUS: NOT DETECTED
CAMPYLOBACTER SPECIES: NOT DETECTED
CYCLOSPORA CAYETANENSIS: NOT DETECTED
Cryptosporidium: NOT DETECTED
ENTAMOEBA HISTOLYTICA: NOT DETECTED
ENTEROPATHOGENIC E COLI (EPEC): NOT DETECTED
ENTEROTOXIGENIC E COLI (ETEC): NOT DETECTED
Enteroaggregative E coli (EAEC): NOT DETECTED
Giardia lamblia: NOT DETECTED
Norovirus GI/GII: NOT DETECTED
Plesimonas shigelloides: NOT DETECTED
Rotavirus A: NOT DETECTED
Salmonella species: NOT DETECTED
Sapovirus (I, II, IV, and V): NOT DETECTED
Shiga like toxin producing E coli (STEC): NOT DETECTED
Shigella/Enteroinvasive E coli (EIEC): NOT DETECTED
VIBRIO CHOLERAE: NOT DETECTED
VIBRIO SPECIES: NOT DETECTED
Yersinia enterocolitica: NOT DETECTED

## 2018-02-09 LAB — C DIFFICILE QUICK SCREEN W PCR REFLEX
C DIFFICLE (CDIFF) ANTIGEN: NEGATIVE
C Diff interpretation: NOT DETECTED
C Diff toxin: NEGATIVE

## 2018-02-15 ENCOUNTER — Other Ambulatory Visit: Payer: Self-pay | Admitting: Gastroenterology

## 2018-02-15 DIAGNOSIS — R1011 Right upper quadrant pain: Secondary | ICD-10-CM

## 2018-02-18 ENCOUNTER — Ambulatory Visit: Payer: Medicare Other

## 2018-02-21 ENCOUNTER — Ambulatory Visit
Admission: RE | Admit: 2018-02-21 | Discharge: 2018-02-21 | Disposition: A | Discharge status: Home / Self Care | Payer: Medicare Other | Source: Ambulatory Visit | Attending: Gastroenterology | Admitting: Gastroenterology

## 2018-02-21 DIAGNOSIS — N1330 Unspecified hydronephrosis: Secondary | ICD-10-CM | POA: Insufficient documentation

## 2018-02-21 DIAGNOSIS — R1011 Right upper quadrant pain: Secondary | ICD-10-CM | POA: Diagnosis not present

## 2018-03-17 ENCOUNTER — Ambulatory Visit: Payer: Medicare Other | Admitting: Certified Registered Nurse Anesthetist

## 2018-03-17 ENCOUNTER — Ambulatory Visit
Admission: RE | Admit: 2018-03-17 | Discharge: 2018-03-17 | Disposition: A | Discharge status: Home / Self Care | Payer: Medicare Other | Source: Ambulatory Visit | Attending: Gastroenterology | Admitting: Gastroenterology

## 2018-03-17 ENCOUNTER — Encounter
Admission: RE | Disposition: A | Discharge status: Home / Self Care | Payer: Self-pay | Source: Ambulatory Visit | Attending: Gastroenterology

## 2018-03-17 ENCOUNTER — Encounter: Payer: Self-pay | Admitting: *Deleted

## 2018-03-17 DIAGNOSIS — Z882 Allergy status to sulfonamides status: Secondary | ICD-10-CM | POA: Diagnosis not present

## 2018-03-17 DIAGNOSIS — Z79899 Other long term (current) drug therapy: Secondary | ICD-10-CM | POA: Diagnosis not present

## 2018-03-17 DIAGNOSIS — K219 Gastro-esophageal reflux disease without esophagitis: Secondary | ICD-10-CM | POA: Insufficient documentation

## 2018-03-17 DIAGNOSIS — K224 Dyskinesia of esophagus: Secondary | ICD-10-CM | POA: Diagnosis not present

## 2018-03-17 DIAGNOSIS — Z881 Allergy status to other antibiotic agents status: Secondary | ICD-10-CM | POA: Insufficient documentation

## 2018-03-17 DIAGNOSIS — I341 Nonrheumatic mitral (valve) prolapse: Secondary | ICD-10-CM | POA: Diagnosis not present

## 2018-03-17 DIAGNOSIS — M1712 Unilateral primary osteoarthritis, left knee: Secondary | ICD-10-CM | POA: Diagnosis not present

## 2018-03-17 DIAGNOSIS — N400 Enlarged prostate without lower urinary tract symptoms: Secondary | ICD-10-CM | POA: Insufficient documentation

## 2018-03-17 DIAGNOSIS — K2960 Other gastritis without bleeding: Secondary | ICD-10-CM | POA: Insufficient documentation

## 2018-03-17 DIAGNOSIS — I10 Essential (primary) hypertension: Secondary | ICD-10-CM | POA: Diagnosis not present

## 2018-03-17 DIAGNOSIS — K449 Diaphragmatic hernia without obstruction or gangrene: Secondary | ICD-10-CM | POA: Insufficient documentation

## 2018-03-17 DIAGNOSIS — K589 Irritable bowel syndrome without diarrhea: Secondary | ICD-10-CM | POA: Diagnosis not present

## 2018-03-17 DIAGNOSIS — F419 Anxiety disorder, unspecified: Secondary | ICD-10-CM | POA: Diagnosis not present

## 2018-03-17 DIAGNOSIS — M2040 Other hammer toe(s) (acquired), unspecified foot: Secondary | ICD-10-CM | POA: Insufficient documentation

## 2018-03-17 DIAGNOSIS — R1013 Epigastric pain: Secondary | ICD-10-CM | POA: Diagnosis present

## 2018-03-17 HISTORY — PX: ESOPHAGOGASTRODUODENOSCOPY (EGD) WITH PROPOFOL: SHX5813

## 2018-03-17 LAB — CBC WITH DIFFERENTIAL/PLATELET
BASOS ABS: 0 10*3/uL (ref 0–0.1)
Basophils Relative: 1 %
Eosinophils Absolute: 0.6 10*3/uL (ref 0–0.7)
Eosinophils Relative: 8 %
HEMATOCRIT: 43 % (ref 40.0–52.0)
Hemoglobin: 14.8 g/dL (ref 13.0–18.0)
LYMPHS PCT: 20 %
Lymphs Abs: 1.5 10*3/uL (ref 1.0–3.6)
MCH: 33.9 pg (ref 26.0–34.0)
MCHC: 34.4 g/dL (ref 32.0–36.0)
MCV: 98.7 fL (ref 80.0–100.0)
Monocytes Absolute: 0.7 10*3/uL (ref 0.2–1.0)
Monocytes Relative: 9 %
NEUTROS ABS: 4.8 10*3/uL (ref 1.4–6.5)
NEUTROS PCT: 62 %
Platelets: 201 10*3/uL (ref 150–440)
RBC: 4.36 MIL/uL — AB (ref 4.40–5.90)
RDW: 13 % (ref 11.5–14.5)
WBC: 7.7 10*3/uL (ref 3.8–10.6)

## 2018-03-17 SURGERY — ESOPHAGOGASTRODUODENOSCOPY (EGD) WITH PROPOFOL
Anesthesia: General

## 2018-03-17 MED ORDER — PROPOFOL 500 MG/50ML IV EMUL
INTRAVENOUS | Status: AC
  Filled 2018-03-17: qty 50

## 2018-03-17 MED ORDER — SODIUM CHLORIDE 0.9 % IV SOLN
INTRAVENOUS | Status: DC
  Administered 2018-03-17 (×2): via INTRAVENOUS

## 2018-03-17 MED ORDER — PROPOFOL 10 MG/ML IV BOLUS
INTRAVENOUS | Status: AC
  Filled 2018-03-17: qty 20

## 2018-03-17 MED ORDER — GLYCOPYRROLATE 0.2 MG/ML IJ SOLN
INTRAMUSCULAR | Status: DC | PRN
  Administered 2018-03-17: 0.2 mg via INTRAVENOUS

## 2018-03-17 MED ORDER — SODIUM CHLORIDE 0.9 % IV SOLN: INTRAVENOUS | Status: DC

## 2018-03-17 MED ORDER — PROPOFOL 500 MG/50ML IV EMUL
INTRAVENOUS | Status: DC | PRN
  Administered 2018-03-17: 80 ug/kg/min via INTRAVENOUS

## 2018-03-17 MED ORDER — LIDOCAINE HCL (CARDIAC) PF 100 MG/5ML IV SOSY
PREFILLED_SYRINGE | INTRAVENOUS | Status: DC | PRN
  Administered 2018-03-17: 60 mg via INTRAVENOUS

## 2018-03-17 MED ORDER — PROPOFOL 10 MG/ML IV BOLUS
INTRAVENOUS | Status: DC | PRN
  Administered 2018-03-17: 20 mg via INTRAVENOUS
  Administered 2018-03-17: 50 mg via INTRAVENOUS

## 2018-03-17 NOTE — Anesthesia Postprocedure Evaluation (Signed)
Anesthesia Post Note  Patient: Roger Watson  Procedure(s) Performed: ESOPHAGOGASTRODUODENOSCOPY (EGD) WITH PROPOFOL (N/A )  Patient location during evaluation: Endoscopy Anesthesia Type: General Level of consciousness: awake and alert Pain management: pain level controlled Vital Signs Assessment: post-procedure vital signs reviewed and stable Respiratory status: spontaneous breathing and respiratory function stable Cardiovascular status: stable Anesthetic complications: no     Last Vitals:  Vitals:   03/17/18 1200 03/17/18 1210  BP: 114/84 128/79  Pulse: 68 68  Resp: 16 15  Temp: (!) 36.2 C   SpO2: 98% 99%    Last Pain:  Vitals:   03/17/18 1200  TempSrc: Tympanic  PainSc: Asleep                 KEPHART,WILLIAM K

## 2018-03-17 NOTE — H&P (Signed)
Outpatient short stay form Pre-procedure 03/17/2018 11:40 AM Lollie Sails MD  Primary Physician: Meade Maw, MD  Reason for visit: EGD  History of present illness: Patient is a 72 year old male presenting today as above.  He has a history of some right upper quadrant intermittent pain and some dyspepsia.  He had been on some ibuprofen and meloxicam in the past.  He had a upper GI series in 2018 indicating moderate reflux and some distal esophageal spasm.  At that time he was using a fair amount of NSAID.  He is currently taking pantoprazole 40 mg once a day.  He currently denies the use of other aspirin products.  He does take in a occasional Advil.    Current Facility-Administered Medications:  .  0.9 %  sodium chloride infusion, , Intravenous, Continuous, Lollie Sails, MD, Last Rate: 20 mL/hr at 03/17/18 1103  Facility-Administered Medications Ordered in Other Encounters:  .  glycopyrrolate (ROBINUL) injection, , , Anesthesia Intra-op, Willette Alma, CRNA, 0.2 mg at 03/17/18 1129 .  lidocaine (cardiac) 100 mg/55mL (XYLOCAINE) injection 2%, , , Anesthesia Intra-op, Willette Alma, CRNA, 60 mg at 03/17/18 1131  Medications Prior to Admission  Medication Sig Dispense Refill Last Dose  . cefdinir (OMNICEF) 300 MG capsule Take 300 mg by mouth 2 (two) times daily.   03/16/2018 at Unknown time  . citalopram (CELEXA) 20 MG tablet Take 20 mg by mouth daily.   03/16/2018 at Unknown time  . doxazosin (CARDURA) 2 MG tablet Take 2 mg by mouth daily.   03/16/2018 at Unknown time  . latanoprost (XALATAN) 0.005 % ophthalmic solution 1 drop at bedtime.   03/16/2018 at Unknown time  . pantoprazole (PROTONIX) 40 MG tablet Take 40 mg by mouth daily.   03/16/2018 at Unknown time  . docusate sodium (COLACE) 100 MG capsule Take 1 capsule (100 mg total) by mouth 2 (two) times daily. 10 capsule 0   . meloxicam (MOBIC) 15 MG tablet Take 15 mg by mouth daily as needed for pain.   Not Taking at Unknown  time  . oxyCODONE (OXY IR/ROXICODONE) 5 MG immediate release tablet Take 1-2 tablets (5-10 mg total) by mouth every 4 (four) hours as needed for moderate pain or severe pain. (Patient not taking: Reported on 03/17/2018) 30 tablet 0 Not Taking at Unknown time     Allergies  Allergen Reactions  . Ciprofloxacin Other (See Comments)    Photosensitivity  . Sulfa Antibiotics Rash     Past Medical History:  Diagnosis Date  . Bony exostosis 02/05/2016  . Capsulitis of left foot 02/05/2016  . Enlarged prostate with lower urinary tract symptoms (LUTS) 09/01/2012  . Essential hypertension 02/08/2017  . GERD (gastroesophageal reflux disease)   . 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 IBS (irritable bowel syndrome) 12/18/2014  . MVP (mitral valve prolapse) 10/19/2016  . Primary osteoarthritis of left knee 03/17/2017    Review of systems:      Physical Exam    Heart and lungs: Regular rate and rhythm without rub or gallop, lungs are bilaterally clear.    HEENT: Normocephalic atraumatic eyes are anicteric    Other:    Pertinant exam for procedure: Soft nontender nondistended bowel sounds positive normoactive    Planned proceedures: EGD and indicated procedures. I have discussed the risks benefits and complications of procedures to include not limited to bleeding, infection, perforation and the risk of sedation and the patient wishes to proceed.    Lollie Sails, MD Gastroenterology 03/17/2018  11:40 AM

## 2018-03-17 NOTE — Anesthesia Preprocedure Evaluation (Signed)
Anesthesia Evaluation  Patient identified by MRN, date of birth, ID band Patient awake    Reviewed: Allergy & Precautions, NPO status , Patient's Chart, lab work & pertinent test results  History of Anesthesia Complications Negative for: history of anesthetic complications  Airway Mallampati: II       Dental   Pulmonary neg sleep apnea, neg COPD,           Cardiovascular hypertension, Pt. on medications (-) Past MI and (-) CHF (-) dysrhythmias (-) Valvular Problems/Murmurs     Neuro/Psych neg Seizures Anxiety    GI/Hepatic Neg liver ROS, GERD  Medicated and Controlled,  Endo/Other  neg diabetes  Renal/GU negative Renal ROS     Musculoskeletal   Abdominal   Peds  Hematology   Anesthesia Other Findings   Reproductive/Obstetrics                             Anesthesia Physical Anesthesia Plan  ASA: II  Anesthesia Plan: General   Post-op Pain Management:    Induction: Intravenous  PONV Risk Score and Plan: 2  Airway Management Planned: Nasal Cannula  Additional Equipment:   Intra-op Plan:   Post-operative Plan:   Informed Consent: I have reviewed the patients History and Physical, chart, labs and discussed the procedure including the risks, benefits and alternatives for the proposed anesthesia with the patient or authorized representative who has indicated his/her understanding and acceptance.     Plan Discussed with:   Anesthesia Plan Comments:         Anesthesia Quick Evaluation

## 2018-03-17 NOTE — Anesthesia Post-op Follow-up Note (Signed)
Anesthesia QCDR form completed.        

## 2018-03-17 NOTE — Transfer of Care (Signed)
Immediate Anesthesia Transfer of Care Note  Patient: Roger Watson  Procedure(s) Performed: ESOPHAGOGASTRODUODENOSCOPY (EGD) WITH PROPOFOL (N/A )  Patient Location: PACU  Anesthesia Type:MAC  Level of Consciousness: awake, alert  and oriented  Airway & Oxygen Therapy: Patient Spontanous Breathing and Patient connected to nasal cannula oxygen  Post-op Assessment: Report given to RN and Post -op Vital signs reviewed and stable  Post vital signs: stable  Last Vitals:  Vitals Value Taken Time  BP    Temp    Pulse 68 03/17/2018 12:00 PM  Resp    SpO2 98 % 03/17/2018 12:00 PM  Vitals shown include unvalidated device data.  Last Pain:  Vitals:   03/17/18 1200  TempSrc: (P) Tympanic  PainSc:          Complications: No apparent anesthesia complications

## 2018-03-17 NOTE — Op Note (Addendum)
[facility] Gastroenterology Patient Name: Roger Watson Procedure Date: 03/17/2018 11:25 AM MRN: [id number] Account #: [id number] Date of Birth: Jan 31, 1946 Admit Type: Outpatient Age: 72 Room: [facility] ENDO ROOM 1 Gender: Male Note Status: Finalized Procedure:            Upper GI endoscopy Indications:          Abdominal pain in the right upper quadrant, Dyspepsia Providers:            Lollie Sails, MD Referring MD:         Baxter Hire, MD (Referring MD) Medicines:            Monitored Anesthesia Care Complications:        No immediate complications. Procedure:            Pre-Anesthesia Assessment:                       - ASA Grade Assessment: II - A patient with mild                        systemic disease.                       After obtaining informed consent, the endoscope was                        passed under direct vision. Throughout the procedure,                        the patient's blood pressure, pulse, and oxygen                        saturations were monitored continuously. The Endoscope                        was introduced through the mouth, and advanced to the                        third part of duodenum. The upper GI endoscopy was                        accomplished without difficulty. The patient tolerated                        the procedure well. Findings:      The Z-line was regular. Biopsies were taken with a cold forceps for       histology.      The exam of the esophagus was otherwise normal.      Diffuse minimal inflammation characterized by erythema was found in the       gastric body. Biopsies were taken with a cold forceps for histology.      The exam of the stomach was otherwise normal.      The examined duodenum was normal.      A small hiatal hernia was found. The Z-line was a variable distance from       incisors; the hiatal hernia was sliding.      Abnormal motility was noted in the distal esophagus. The  cricopharyngeus       was normal. There is spasticity of the esophageal body. Tertiary       peristaltic waves  are noted. Impression:           - Z-line regular. Biopsied.                       - Gastritis. Biopsied.                       - Normal examined duodenum.                       - Small hiatal hernia. Recommendation:       - Discharge patient to home.                       - Continue present medications.                       - Await pathology results.                       - Telephone GI clinic for pathology results in 1 week. Procedure Code(s):    --- Professional ---                       [phone], Esophagogastroduodenoscopy, flexible, transoral;                        with biopsy, single or multiple Diagnosis Code(s):    --- Professional ---                       K29.70, Gastritis, unspecified, without bleeding                       K44.9, Diaphragmatic hernia without obstruction or                        gangrene                       R10.11, Right upper quadrant pain                       R10.13, Epigastric pain CPT copyright 2017 American Medical Association. All rights reserved. The codes documented in this report are preliminary and upon coder review may  be revised to meet current compliance requirements. Lollie Sails, MD 03/17/2018 12:01:04 PM This report has been signed electronically. Number of Addenda: 0 Note Initiated On: 03/17/2018 11:25 AM      [facility]

## 2018-03-18 ENCOUNTER — Encounter: Payer: Self-pay | Admitting: Gastroenterology

## 2018-03-21 LAB — SURGICAL PATHOLOGY

## 2018-03-23 ENCOUNTER — Other Ambulatory Visit: Payer: Self-pay | Admitting: Gastroenterology

## 2018-03-23 DIAGNOSIS — R1011 Right upper quadrant pain: Secondary | ICD-10-CM

## 2018-03-25 ENCOUNTER — Other Ambulatory Visit: Payer: Self-pay | Admitting: Physician Assistant

## 2018-03-25 DIAGNOSIS — H9201 Otalgia, right ear: Secondary | ICD-10-CM

## 2018-04-01 ENCOUNTER — Ambulatory Visit
Admission: RE | Admit: 2018-04-01 | Discharge: 2018-04-01 | Disposition: A | Discharge status: Home / Self Care | Payer: Medicare Other | Source: Ambulatory Visit | Attending: Physician Assistant | Admitting: Physician Assistant

## 2018-04-01 DIAGNOSIS — H9201 Otalgia, right ear: Secondary | ICD-10-CM | POA: Diagnosis not present

## 2018-04-01 DIAGNOSIS — J322 Chronic ethmoidal sinusitis: Secondary | ICD-10-CM | POA: Insufficient documentation

## 2018-04-04 ENCOUNTER — Ambulatory Visit
Admission: RE | Admit: 2018-04-04 | Discharge: 2018-04-04 | Disposition: A | Discharge status: Home / Self Care | Payer: Medicare Other | Source: Ambulatory Visit | Attending: Gastroenterology | Admitting: Gastroenterology

## 2018-04-04 DIAGNOSIS — R1011 Right upper quadrant pain: Secondary | ICD-10-CM | POA: Diagnosis not present

## 2018-04-04 MED ORDER — TECHNETIUM TC 99M SULFUR COLLOID
5.4050 | Freq: Once | INTRAVENOUS | Status: AC | PRN
  Administered 2018-04-04: 5.405 via INTRAVENOUS

## 2018-11-25 HISTORY — PX: TOTAL KNEE ARTHROPLASTY: SHX125

## 2018-11-28 ENCOUNTER — Encounter
Admission: RE | Admit: 2018-11-28 | Discharge: 2018-11-28 | Disposition: A | Discharge status: Home / Self Care | Payer: Medicare Other | Source: Ambulatory Visit | Attending: Internal Medicine | Admitting: Internal Medicine

## 2018-11-30 ENCOUNTER — Encounter: Payer: Self-pay | Admitting: Adult Health

## 2018-11-30 ENCOUNTER — Non-Acute Institutional Stay (SKILLED_NURSING_FACILITY): Payer: Medicare Other | Admitting: Adult Health

## 2018-11-30 DIAGNOSIS — Z96652 Presence of left artificial knee joint: Secondary | ICD-10-CM

## 2018-11-30 DIAGNOSIS — N401 Enlarged prostate with lower urinary tract symptoms: Secondary | ICD-10-CM

## 2018-11-30 DIAGNOSIS — F418 Other specified anxiety disorders: Secondary | ICD-10-CM

## 2018-11-30 DIAGNOSIS — K219 Gastro-esophageal reflux disease without esophagitis: Secondary | ICD-10-CM

## 2018-11-30 DIAGNOSIS — M1712 Unilateral primary osteoarthritis, left knee: Secondary | ICD-10-CM

## 2018-11-30 DIAGNOSIS — I10 Essential (primary) hypertension: Secondary | ICD-10-CM

## 2018-11-30 NOTE — Progress Notes (Signed)
[facility]   [facility] Room Number: [place] of Service:  SNF (31)   CODE STATUS: Full Code  Allergies  Allergen Reactions  . Ciprofloxacin Other (See Comments)    Photosensitivity  . Sulfa Antibiotics Rash    Chief Complaint  Patient presents with  . Hospitalization Follow-up    Hospital follow up    HPI:  He has been hospitalized for a left knee replacement. He is here for short term rehab his goal is to return back home. He denies any uncontrolled left knee pain; no changes in his appetite no insomnia; no worries or concerns. He will continue to be followed for his chronic illnesses: gerd hypertension; luts   Past Medical History:  Diagnosis Date  . Bony exostosis 02/05/2016  . Capsulitis of left foot 02/05/2016  . Enlarged prostate with lower urinary tract symptoms (LUTS) 09/01/2012  . Essential hypertension 02/08/2017  . GERD (gastroesophageal reflux disease)   . 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 IBS (irritable bowel syndrome) 12/18/2014  . MVP (mitral valve prolapse) 10/19/2016  . Primary osteoarthritis of left knee 03/17/2017    Past Surgical History:  Procedure Laterality Date  . ANTERIOR APPROACH HEMI HIP ARTHROPLASTY Left 10/19/2017   Procedure: ANTERIOR APPROACH HEMI HIP ARTHROPLASTY;  Surgeon: Hessie Knows, MD;  Location: [facility] ORS;  Service: Orthopedics;  Laterality: Left;  . ESOPHAGOGASTRODUODENOSCOPY (EGD) WITH PROPOFOL N/A 03/17/2018   Procedure: ESOPHAGOGASTRODUODENOSCOPY (EGD) WITH PROPOFOL;  Surgeon: Lollie Sails, MD;  Location: [facility] ENDOSCOPY;  Service: Endoscopy;  Laterality: N/A;  . URETEROSCOPY WITH HOLMIUM LASER LITHOTRIPSY  1978    Social History   Socioeconomic History  . Marital status: Single    Spouse name: Not on file  . Number of children: Not on file  . Years of education: Not on file  . Highest education level: Not on file  Occupational History  . Not on file  Social Needs  . Financial resource strain: Not on  file  . Food insecurity:    Worry: Not on file    Inability: Not on file  . Transportation needs:    Medical: Not on file    Non-medical: Not on file  Tobacco Use  . Smoking status: Never Smoker  . Smokeless tobacco: Never Used  Substance and Sexual Activity  . Alcohol use: Yes    Comment: daily  . Drug use: No  . Sexual activity: Not on file  Lifestyle  . Physical activity:    Days per week: Not on file    Minutes per session: Not on file  . Stress: Not on file  Relationships  . Social connections:    Talks on phone: Not on file    Gets together: Not on file    Attends religious service: Not on file    Active member of club or organization: Not on file    Attends meetings of clubs or organizations: Not on file    Relationship status: Not on file  . Intimate partner violence:    Fear of current or ex partner: Not on file    Emotionally abused: Not on file    Physically abused: Not on file    Forced sexual activity: Not on file  Other Topics Concern  . Not on file  Social History Narrative  . Not on file   Family History  Problem Relation Age of Onset  . CAD Mother   . CAD Father       VITAL SIGNS BP  113/62   Pulse 89   Temp 98.8 F (37.1 C)   Resp 20   Ht 5\' 7"  (1.702 m)   Wt 160 lb 11.2 oz (72.9 kg)   SpO2 95%   BMI 25.17 kg/m   Outpatient Encounter Medications as of 11/30/2018  Medication Sig  . acetaminophen (TYLENOL) 500 MG tablet Take 1,000 mg by mouth every 8 (eight) hours as needed.  Marland Kitchen apixaban (ELIQUIS) 2.5 MG TABS tablet Take 2.5 mg by mouth 2 (two) times daily.  . Chondroitin Sulfate (OPTIFLEX-C) 400 MG CAPS Take 1 capsule by mouth daily.  . citalopram (CELEXA) 20 MG tablet Take 20 mg by mouth daily.  Marland Kitchen doxazosin (CARDURA) 2 MG tablet Take 2 mg by mouth daily.  Marland Kitchen ketoconazole (NIZORAL) 2 % shampoo Apply 1 application topically 3 (three) times a week. Once a day on Monday, Wednesday and Fridayh  . latanoprost (XALATAN) 0.005 % ophthalmic  solution Place 1 drop into both eyes at bedtime.   . Multiple Vitamin (MULTIVITAMIN) tablet Take 1 tablet by mouth daily.  . NON FORMULARY Diet Type:  Regular  . oxyCODONE (ROXICODONE) 5 MG immediate release tablet Take 5 mg by mouth every 6 (six) hours as needed for severe pain.  . pantoprazole (PROTONIX) 40 MG tablet Take 40 mg by mouth daily.  . traMADol (ULTRAM) 50 MG tablet Take 50 mg by mouth every 6 (six) hours as needed.   No facility-administered encounter medications on file as of 11/30/2018.      SIGNIFICANT DIAGNOSTIC EXAMS  LABS REVIEWED:   09-15-18: wbc 6.9 hgb 14.2; hct 41.5; mcv 98,8; plt 159   Review of Systems  Constitutional: Negative for malaise/fatigue.  Respiratory: Negative for cough and shortness of breath.   Cardiovascular: Negative for chest pain, palpitations and leg swelling.  Gastrointestinal: Negative for abdominal pain, constipation and heartburn.  Musculoskeletal: Negative for back pain, joint pain and myalgias.  Skin: Negative.   Neurological: Negative for dizziness.  Psychiatric/Behavioral: The patient is not nervous/anxious.     Physical Exam Constitutional:      General: He is not in acute distress.    Appearance: He is well-developed. He is not diaphoretic.  Neck:     Musculoskeletal: Neck supple.     Thyroid: No thyromegaly.  Cardiovascular:     Rate and Rhythm: Normal rate and regular rhythm.     Pulses: Normal pulses.     Heart sounds: Normal heart sounds.  Pulmonary:     Effort: Pulmonary effort is normal. No respiratory distress.     Breath sounds: Normal breath sounds.  Abdominal:     General: Bowel sounds are normal. There is no distension.     Palpations: Abdomen is soft.     Tenderness: There is no abdominal tenderness.  Musculoskeletal:     Right lower leg: No edema.     Left lower leg: No edema.     Comments: Is able to move all extremities Is status post left knee replacement   Lymphadenopathy:     Cervical: No  cervical adenopathy.  Skin:    General: Skin is warm and dry.  Neurological:     Mental Status: He is alert and oriented to person, place, and time.  Psychiatric:        Mood and Affect: Mood normal.        ASSESSMENT/ PLAN:  TODAY:   1. Essential hypertension: is stable b/p 113/62: will continue cardura 2 mg daily will monitor  2.  gerd without  esophagitis: is stable will continue protonix 40 mg daily   3. Primary osteoarthritis left knee: is status post left knee replacement: is stable will continue therapy as directed and will follow up with orthopedics. Will continue eliquis 2.5 mg twice daily has oxycodone 5 mg every 6 hours as needed and ultram 50 mg every 6 hours as needed  4. Enlarged prostate with lower urinary tract symptoms: is stable will continue cardura 2 mg daily   5. Depression with anxiety: is stable will continue celexa 20 mg daily     MD is aware of resident's narcotic use and is in agreement with current plan of care. We will attempt to wean resident as apropriate   Ok Edwards NP [facility] Adult Medicine  Contact [phone] Monday through Friday 8am- 5pm  After hours call [phone]

## 2018-12-01 ENCOUNTER — Other Ambulatory Visit: Payer: Self-pay | Admitting: Adult Health

## 2018-12-01 DIAGNOSIS — K219 Gastro-esophageal reflux disease without esophagitis: Secondary | ICD-10-CM | POA: Insufficient documentation

## 2018-12-01 DIAGNOSIS — F418 Other specified anxiety disorders: Secondary | ICD-10-CM | POA: Insufficient documentation

## 2018-12-01 DIAGNOSIS — Z96652 Presence of left artificial knee joint: Secondary | ICD-10-CM | POA: Insufficient documentation

## 2018-12-02 ENCOUNTER — Non-Acute Institutional Stay (SKILLED_NURSING_FACILITY): Payer: Medicare Other | Admitting: Adult Health

## 2018-12-02 ENCOUNTER — Other Ambulatory Visit: Payer: Self-pay | Admitting: Adult Health

## 2018-12-02 ENCOUNTER — Encounter: Payer: Self-pay | Admitting: Adult Health

## 2018-12-02 DIAGNOSIS — Z96652 Presence of left artificial knee joint: Secondary | ICD-10-CM | POA: Diagnosis not present

## 2018-12-02 DIAGNOSIS — M1712 Unilateral primary osteoarthritis, left knee: Secondary | ICD-10-CM

## 2018-12-02 DIAGNOSIS — I10 Essential (primary) hypertension: Secondary | ICD-10-CM | POA: Diagnosis not present

## 2018-12-02 MED ORDER — TRAMADOL HCL 50 MG PO TABS: 50.0000 mg | ORAL_TABLET | Freq: Four times a day (QID) | ORAL | 0 refills | Status: AC | PRN

## 2018-12-02 MED ORDER — APIXABAN 2.5 MG PO TABS: 2.5000 mg | ORAL_TABLET | Freq: Two times a day (BID) | ORAL | 0 refills | Status: DC

## 2018-12-02 MED ORDER — DOXAZOSIN MESYLATE 2 MG PO TABS: 2.0000 mg | ORAL_TABLET | Freq: Every day | ORAL | 0 refills | Status: DC

## 2018-12-02 MED ORDER — PANTOPRAZOLE SODIUM 40 MG PO TBEC: 40.0000 mg | DELAYED_RELEASE_TABLET | Freq: Every day | ORAL | 0 refills | Status: AC

## 2018-12-02 MED ORDER — OXYCODONE HCL 5 MG PO TABS: 5.0000 mg | ORAL_TABLET | Freq: Four times a day (QID) | ORAL | 0 refills | Status: DC | PRN

## 2018-12-02 MED ORDER — CITALOPRAM HYDROBROMIDE 20 MG PO TABS: 20.0000 mg | ORAL_TABLET | Freq: Every day | ORAL | 0 refills | Status: AC

## 2018-12-02 MED ORDER — LATANOPROST 0.005 % OP SOLN: 1.0000 [drp] | Freq: Every day | OPHTHALMIC | 0 refills | Status: AC

## 2018-12-02 NOTE — Progress Notes (Signed)
[facility]   [facility] Room Number: [place] of Service:  SNF (31)    CODE STATUS: Full Code  Allergies  Allergen Reactions  . Ciprofloxacin Other (See Comments)    Photosensitivity  . Sulfa Antibiotics Rash    Chief Complaint  Patient presents with  . Discharge Note    Discharging to home on 12/04/2018    HPI:  He is being discharged to home with home health for pt/ot. He will not need any dme. He will need his prescriptions written and will need to follow up with his medical provider. He had been hospitalized for a left knee replacement. He was admitted to this facility for short term rehab. He is now ready for discharge to home.     Past Medical History:  Diagnosis Date  . Bony exostosis 02/05/2016  . Capsulitis of left foot 02/05/2016  . Enlarged prostate with lower urinary tract symptoms (LUTS) 09/01/2012  . Essential hypertension 02/08/2017  . GERD (gastroesophageal reflux disease)   . 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 IBS (irritable bowel syndrome) 12/18/2014  . MVP (mitral valve prolapse) 10/19/2016  . Primary osteoarthritis of left knee 03/17/2017    Past Surgical History:  Procedure Laterality Date  . ANTERIOR APPROACH HEMI HIP ARTHROPLASTY Left 10/19/2017   Procedure: ANTERIOR APPROACH HEMI HIP ARTHROPLASTY;  Surgeon: Hessie Knows, MD;  Location: [facility] ORS;  Service: Orthopedics;  Laterality: Left;  . ESOPHAGOGASTRODUODENOSCOPY (EGD) WITH PROPOFOL N/A 03/17/2018   Procedure: ESOPHAGOGASTRODUODENOSCOPY (EGD) WITH PROPOFOL;  Surgeon: Lollie Sails, MD;  Location: [facility] ENDOSCOPY;  Service: Endoscopy;  Laterality: N/A;  . URETEROSCOPY WITH HOLMIUM LASER LITHOTRIPSY  1978    Social History   Socioeconomic History  . Marital status: Single    Spouse name: Not on file  . Number of children: Not on file  . Years of education: Not on file  . Highest education level: Not on file  Occupational History  . Not on file  Social Needs  . Financial  resource strain: Not on file  . Food insecurity:    Worry: Not on file    Inability: Not on file  . Transportation needs:    Medical: Not on file    Non-medical: Not on file  Tobacco Use  . Smoking status: Never Smoker  . Smokeless tobacco: Never Used  Substance and Sexual Activity  . Alcohol use: Yes    Comment: daily  . Drug use: No  . Sexual activity: Not on file  Lifestyle  . Physical activity:    Days per week: Not on file    Minutes per session: Not on file  . Stress: Not on file  Relationships  . Social connections:    Talks on phone: Not on file    Gets together: Not on file    Attends religious service: Not on file    Active member of club or organization: Not on file    Attends meetings of clubs or organizations: Not on file    Relationship status: Not on file  . Intimate partner violence:    Fear of current or ex partner: Not on file    Emotionally abused: Not on file    Physically abused: Not on file    Forced sexual activity: Not on file  Other Topics Concern  . Not on file  Social History Narrative  . Not on file   Family History  Problem Relation Age of Onset  . CAD Mother   .  CAD Father     VITAL SIGNS BP 122/73   Pulse 93   Temp 98.2 F (36.8 C)   Resp 18   Ht 5\' 7"  (1.702 m)   Wt 160 lb 11.2 oz (72.9 kg)   SpO2 99%   BMI 25.17 kg/m   Patient's Medications  New Prescriptions   No medications on file  Previous Medications   ACETAMINOPHEN (TYLENOL) 500 MG TABLET    Take 1,000 mg by mouth every 8 (eight) hours as needed.   APIXABAN (ELIQUIS) 2.5 MG TABS TABLET    Take 2.5 mg by mouth 2 (two) times daily.   CHONDROITIN SULFATE (OPTIFLEX-C) 400 MG CAPS    Take 1 capsule by mouth daily.   CITALOPRAM (CELEXA) 20 MG TABLET    Take 20 mg by mouth daily.   DOXAZOSIN (CARDURA) 2 MG TABLET    Take 2 mg by mouth daily.   KETOCONAZOLE (NIZORAL) 2 % SHAMPOO    Apply 1 application topically 3 (three) times a week. Once a day on Monday, Wednesday and  Friday   LATANOPROST (XALATAN) 0.005 % OPHTHALMIC SOLUTION    Place 1 drop into both eyes at bedtime.    MULTIPLE VITAMIN (MULTIVITAMIN) TABLET    Take 1 tablet by mouth daily.   NON FORMULARY    Diet Type:  Regular   OXYCODONE (ROXICODONE) 5 MG IMMEDIATE RELEASE TABLET    Take 5 mg by mouth every 6 (six) hours as needed for severe pain.   PANTOPRAZOLE (PROTONIX) 40 MG TABLET    Take 40 mg by mouth daily.   TRAMADOL (ULTRAM) 50 MG TABLET    Take 50 mg by mouth every 6 (six) hours as needed.  Modified Medications   No medications on file  Discontinued Medications   No medications on file     SIGNIFICANT DIAGNOSTIC EXAMS  LABS REVIEWED: PREVIOUS   09-15-18: wbc 6.9 hgb 14.2; hct 41.5; mcv 98,8; plt 159  NO NEW LABS.    Review of Systems  Constitutional: Negative for malaise/fatigue.  Respiratory: Negative for cough and shortness of breath.   Cardiovascular: Negative for chest pain, palpitations and leg swelling.  Gastrointestinal: Negative for abdominal pain, constipation and heartburn.  Musculoskeletal: Negative for back pain, joint pain and myalgias.  Skin: Negative.   Neurological: Negative for dizziness.  Psychiatric/Behavioral: The patient is not nervous/anxious.      Physical Exam Constitutional:      General: He is not in acute distress.    Appearance: He is well-developed. He is not diaphoretic.  Neck:     Musculoskeletal: Neck supple.     Thyroid: No thyromegaly.  Cardiovascular:     Rate and Rhythm: Normal rate and regular rhythm.     Pulses: Normal pulses.     Heart sounds: Normal heart sounds.  Pulmonary:     Effort: Pulmonary effort is normal. No respiratory distress.     Breath sounds: Normal breath sounds.  Abdominal:     General: Bowel sounds are normal. There is no distension.     Palpations: Abdomen is soft.     Tenderness: There is no abdominal tenderness.  Musculoskeletal:     Right lower leg: No edema.     Left lower leg: No edema.      Comments:  Is able to move all extremities Is status post left knee replacement    Lymphadenopathy:     Cervical: No cervical adenopathy.  Skin:    General: Skin is warm and dry.  Comments: Incision line without signs of infection present.   Neurological:     Mental Status: He is alert and oriented to person, place, and time.  Psychiatric:        Mood and Affect: Mood normal.     ASSESSMENT/ PLAN:   Patient is being discharged with the following home health services:  Pt/ot: to evaluate and treat as indicated for gait balance strength adl training.   Patient is being discharged with the following durable medical equipment: none needed    Patient has been advised to f/u with their PCP in 1-2 weeks to bring them up to date on their rehab stay.  Social services at facility was responsible for arranging this appointment.  Pt was provided with a 30 day supply of prescriptions for medications and refills must be obtained from their PCP.  For controlled substances, a more limited supply may be provided adequate until PCP appointment only.  A 30 day supply of his prescription medications have been sent to [facility] with #15 oxycodone 5 mg tabs; and #15 ultram 50 mg tabs   Time spent with patient: 35 minutes: discussed medications; home health needs and dme; verbalized understanding.    Ok Edwards NP [facility] Adult Medicine  Contact [phone] Monday through Friday 8am- 5pm  After hours call [phone]

## 2019-01-21 ENCOUNTER — Other Ambulatory Visit: Payer: Self-pay | Admitting: Adult Health

## 2019-01-24 ENCOUNTER — Encounter
Admission: RE | Admit: 2019-01-24 | Discharge: 2019-01-24 | Disposition: A | Discharge status: Home / Self Care | Payer: Medicare Other | Source: Ambulatory Visit | Attending: Unknown Physician Specialty | Admitting: Unknown Physician Specialty

## 2019-01-24 ENCOUNTER — Other Ambulatory Visit: Payer: Self-pay

## 2019-01-24 NOTE — Patient Instructions (Signed)
Your procedure is scheduled on: 01/25/19 Report to [place] at 8:00 am To find out your arrival time please call [phone] between 1PM - 3PM on .  Remember: Instructions that are not followed completely may result in serious medical risk, up to and including death, or upon the discretion of your surgeon and anesthesiologist your surgery may need to be rescheduled.     _X__ 1. Do not eat food after midnight the night before your procedure.                 No gum chewing or hard candies. You may drink clear liquids up to 2 hours                 before you are scheduled to arrive for your surgery- DO not drink clear                 liquids within 2 hours of the start of your surgery.                 Clear Liquids include:  water, apple juice without pulp, clear carbohydrate                 drink such as Clearfast or Gatorade, Black Coffee or Tea (Do not add                 anything to coffee or tea).  __X__2.  On the morning of surgery brush your teeth with toothpaste and water, you                 may rinse your mouth with mouthwash if you wish.  Do not swallow any              toothpaste of mouthwash.     _X__ 3.  No Alcohol for 24 hours before or after surgery.   _X__ 4.  Do Not Smoke or use e-cigarettes For 24 Hours Prior to Your Surgery.                 Do not use any chewable tobacco products for at least 6 hours prior to                 surgery.  ____  5.  Bring all medications with you on the day of surgery if instructed.   __X__  6.  Notify your doctor if there is any change in your medical condition      (cold, fever, infections).     Do not wear jewelry, make-up, hairpins, clips or nail polish. Do not wear lotions, powders, or perfumes.  Do not shave 48 hours prior to surgery. Men may shave face and neck. Do not bring valuables to the hospital.    [facility] is not responsible for any belongings or  valuables.  Contacts, dentures/partials or body piercings may not be worn into surgery. Bring a case for your contacts, glasses or hearing aids, a denture cup will be supplied. Leave your suitcase in the car. After surgery it may be brought to your room. For patients admitted to the hospital, discharge time is determined by your treatment team.   Patients discharged the day of surgery will not be allowed to drive home.   Please read over the following fact sheets that you were given:   MRSA Information  __X__ Take these medicines the morning of surgery with A SIP OF WATER:  1. pantoprazole (PROTONIX  2. doxazosin (CARDURA  3.   4.  5.  6.  ____ Fleet Enema (as directed)   ____ Use CHG Soap/SAGE wipes as directed  ____ Use inhalers on the day of surgery  ____ Stop metformin/Janumet/Farxiga 2 days prior to surgery    ____ Take 1/2 of usual insulin dose the night before surgery. No insulin the morning          of surgery.   ____ Stop Blood Thinners Coumadin/Plavix/Xarelto/Pleta/Pradaxa/Eliquis/Effient/Aspirin  on   Or contact your Surgeon, Cardiologist or Medical Doctor regarding  ability to stop your blood thinners  __X__ Stop Anti-inflammatories 7 days before surgery such as Advil, Ibuprofen, Motrin,  BC or Goodies Powder, Naprosyn, Naproxen, Aleve, Aspirin    __X__ Stop all herbal supplements, fish oil or vitamin E until after surgery.    ____ Bring C-Pap to the hospital.     PHONE INTERVIEW. INSTRUCTIONS PROVIDED, PATIENT VERBALIZED UNDERSTANDING.

## 2019-01-25 ENCOUNTER — Ambulatory Visit: Payer: Medicare Other | Admitting: Certified Registered"

## 2019-01-25 ENCOUNTER — Encounter
Admission: RE | Disposition: A | Discharge status: Home / Self Care | Payer: Self-pay | Source: Home / Self Care | Attending: Unknown Physician Specialty

## 2019-01-25 ENCOUNTER — Ambulatory Visit
Admission: RE | Admit: 2019-01-25 | Discharge: 2019-01-25 | Disposition: A | Discharge status: Home / Self Care | Payer: Medicare Other | Attending: Unknown Physician Specialty | Admitting: Unknown Physician Specialty

## 2019-01-25 ENCOUNTER — Encounter: Payer: Self-pay | Admitting: *Deleted

## 2019-01-25 DIAGNOSIS — Z96652 Presence of left artificial knee joint: Secondary | ICD-10-CM | POA: Insufficient documentation

## 2019-01-25 DIAGNOSIS — M25562 Pain in left knee: Secondary | ICD-10-CM | POA: Diagnosis not present

## 2019-01-25 DIAGNOSIS — F329 Major depressive disorder, single episode, unspecified: Secondary | ICD-10-CM | POA: Insufficient documentation

## 2019-01-25 DIAGNOSIS — Z79899 Other long term (current) drug therapy: Secondary | ICD-10-CM | POA: Diagnosis not present

## 2019-01-25 DIAGNOSIS — G8929 Other chronic pain: Secondary | ICD-10-CM | POA: Insufficient documentation

## 2019-01-25 DIAGNOSIS — I10 Essential (primary) hypertension: Secondary | ICD-10-CM | POA: Diagnosis not present

## 2019-01-25 DIAGNOSIS — T8482XA Fibrosis due to internal orthopedic prosthetic devices, implants and grafts, initial encounter: Secondary | ICD-10-CM | POA: Diagnosis present

## 2019-01-25 DIAGNOSIS — K219 Gastro-esophageal reflux disease without esophagitis: Secondary | ICD-10-CM | POA: Diagnosis not present

## 2019-01-25 DIAGNOSIS — Y831 Surgical operation with implant of artificial internal device as the cause of abnormal reaction of the patient, or of later complication, without mention of misadventure at the time of the procedure: Secondary | ICD-10-CM | POA: Insufficient documentation

## 2019-01-25 DIAGNOSIS — F419 Anxiety disorder, unspecified: Secondary | ICD-10-CM | POA: Diagnosis not present

## 2019-01-25 DIAGNOSIS — Z7901 Long term (current) use of anticoagulants: Secondary | ICD-10-CM | POA: Insufficient documentation

## 2019-01-25 DIAGNOSIS — Z791 Long term (current) use of non-steroidal anti-inflammatories (NSAID): Secondary | ICD-10-CM | POA: Insufficient documentation

## 2019-01-25 HISTORY — PX: KNEE CLOSED REDUCTION: SHX995

## 2019-01-25 SURGERY — MANIPULATION, KNEE, CLOSED
Anesthesia: General | Laterality: Left

## 2019-01-25 MED ORDER — PROPOFOL 10 MG/ML IV BOLUS
INTRAVENOUS | Status: AC
  Filled 2019-01-25: qty 20

## 2019-01-25 MED ORDER — LIDOCAINE HCL (PF) 2 % IJ SOLN
INTRAMUSCULAR | Status: AC
  Filled 2019-01-25: qty 10

## 2019-01-25 MED ORDER — MIDAZOLAM HCL 2 MG/2ML IJ SOLN
INTRAMUSCULAR | Status: DC | PRN
  Administered 2019-01-25: 1 mg via INTRAVENOUS

## 2019-01-25 MED ORDER — LIDOCAINE HCL (CARDIAC) PF 100 MG/5ML IV SOSY
PREFILLED_SYRINGE | INTRAVENOUS | Status: DC | PRN
  Administered 2019-01-25: 100 mg via INTRAVENOUS

## 2019-01-25 MED ORDER — FENTANYL CITRATE (PF) 100 MCG/2ML IJ SOLN
INTRAMUSCULAR | Status: AC
  Filled 2019-01-25: qty 2

## 2019-01-25 MED ORDER — FENTANYL CITRATE (PF) 100 MCG/2ML IJ SOLN
INTRAMUSCULAR | Status: DC | PRN
  Administered 2019-01-25: 25 ug via INTRAVENOUS
  Administered 2019-01-25: 50 ug via INTRAVENOUS
  Administered 2019-01-25: 25 ug via INTRAVENOUS

## 2019-01-25 MED ORDER — ONDANSETRON HCL 4 MG/2ML IJ SOLN
INTRAMUSCULAR | Status: AC
  Filled 2019-01-25: qty 2

## 2019-01-25 MED ORDER — BUPIVACAINE-EPINEPHRINE (PF) 0.25% -1:200000 IJ SOLN
INTRAMUSCULAR | Status: AC
  Filled 2019-01-25: qty 30

## 2019-01-25 MED ORDER — MIDAZOLAM HCL 2 MG/2ML IJ SOLN
INTRAMUSCULAR | Status: AC
  Filled 2019-01-25: qty 2

## 2019-01-25 MED ORDER — ONDANSETRON HCL 4 MG/2ML IJ SOLN
INTRAMUSCULAR | Status: DC | PRN
  Administered 2019-01-25: 4 mg via INTRAVENOUS

## 2019-01-25 MED ORDER — HYDROMORPHONE HCL 1 MG/ML IJ SOLN: 0.2500 mg | INTRAMUSCULAR | Status: DC | PRN

## 2019-01-25 MED ORDER — PROPOFOL 10 MG/ML IV BOLUS
INTRAVENOUS | Status: DC | PRN
  Administered 2019-01-25: 140 mg via INTRAVENOUS

## 2019-01-25 MED ORDER — LACTATED RINGERS IV SOLN
INTRAVENOUS | Status: DC
  Administered 2019-01-25: 08:00:00 via INTRAVENOUS

## 2019-01-25 MED ORDER — KETOROLAC TROMETHAMINE 30 MG/ML IJ SOLN
INTRAMUSCULAR | Status: DC | PRN
  Administered 2019-01-25: 30 mg via INTRAVENOUS

## 2019-01-25 MED ORDER — TRIAMCINOLONE ACETONIDE 40 MG/ML IJ SUSP
INTRAMUSCULAR | Status: AC
  Filled 2019-01-25: qty 1

## 2019-01-25 MED ORDER — KETOROLAC TROMETHAMINE 30 MG/ML IJ SOLN
INTRAMUSCULAR | Status: AC
  Filled 2019-01-25: qty 1

## 2019-01-25 SURGICAL SUPPLY — 8 items
BNDG ADH 2 X3.75 FABRIC TAN LF (GAUZE/BANDAGES/DRESSINGS) ×1 IMPLANT
COVER WAND RF STERILE (DRAPES) ×1 IMPLANT
KIT TURNOVER KIT A (KITS) ×3 IMPLANT
NDL HYPO 21X1.5 SAFETY (NEEDLE) IMPLANT
NEEDLE HYPO 21X1.5 SAFETY (NEEDLE) IMPLANT
PAD ALCOHOL SWAB (MISCELLANEOUS) IMPLANT
STRAP SAFETY 5IN WIDE (MISCELLANEOUS) ×3 IMPLANT
SYR 10ML LL (SYRINGE) IMPLANT

## 2019-01-25 NOTE — Op Note (Signed)
01/25/2019  10:23 AM  PATIENT:  Roger Watson  73 y.o. male  PRE-OPERATIVE DIAGNOSIS: Arthrofibrosis left knee  POST-OPERATIVE DIAGNOSIS: Same  PROCEDURE:  Procedure(s): CLOSED MANIPULATION KNEE LEFT (Left)  SURGEON:   Mariel Kansky., MD  ANESTHESIA: General  HISTORY: Patient had a robotic assisted total knee replacement on the left about 2 months ago.  His left knee motion had not progressed with PT.  He was brought in for manipulation of his left knee under anesthesia because of his postop arthrofibrosis.  OP NOTE: Patient was taken the operating room where satisfactory general anesthesia was achieved.  The left knee was manipulated.  I was able to increase his left knee motion from about 65 to 70 degrees to about 85 degrees.  Patient was then awakened and transferred to his stretcher bed.  He was then taken to the recovery room in satisfactory condition.

## 2019-01-25 NOTE — Anesthesia Preprocedure Evaluation (Addendum)
Anesthesia Evaluation  Patient identified by MRN, date of birth, ID band Patient awake    Reviewed: Allergy & Precautions, H&P , NPO status , Patient's Chart, lab work & pertinent test results  Airway Mallampati: II  TM Distance: >3 FB     Dental  (+) Teeth Intact   Pulmonary neg pulmonary ROS,           Cardiovascular hypertension, + Valvular Problems/Murmurs MVP      Neuro/Psych PSYCHIATRIC DISORDERS Anxiety Depression negative neurological ROS     GI/Hepatic Neg liver ROS, GERD  ,  Endo/Other  negative endocrine ROS  Renal/GU      Musculoskeletal   Abdominal   Peds  Hematology negative hematology ROS (+)   Anesthesia Other Findings Past Medical History: 02/05/2016: Bony exostosis 02/05/2016: Capsulitis of left foot 09/01/2012: Enlarged prostate with lower urinary tract symptoms (LUTS) 02/08/2017: Essential hypertension No date: GERD (gastroesophageal reflux disease) 02/05/2016: Hammertoe 12/18/2014: IBS (irritable bowel syndrome) 10/19/2016: MVP (mitral valve prolapse) 03/17/2017: Primary osteoarthritis of left knee  Past Surgical History: 10/19/2017: ANTERIOR APPROACH HEMI HIP ARTHROPLASTY; Left     Comment:  Procedure: ANTERIOR APPROACH HEMI HIP ARTHROPLASTY;                Surgeon: Hessie Knows, MD;  Location: [facility] ORS;                Service: Orthopedics;  Laterality: Left; 03/17/2018: ESOPHAGOGASTRODUODENOSCOPY (EGD) WITH PROPOFOL; N/A     Comment:  Procedure: ESOPHAGOGASTRODUODENOSCOPY (EGD) WITH               PROPOFOL;  Surgeon: Lollie Sails, MD;  Location:               [facility] ENDOSCOPY;  Service: Endoscopy;  Laterality: N/A; 2018: JOINT REPLACEMENT; Left 11/25/2018: TOTAL KNEE ARTHROPLASTY; Left 1978: URETEROSCOPY WITH HOLMIUM LASER LITHOTRIPSY  BMI    Body Mass Index:  22.81 kg/m      Reproductive/Obstetrics negative OB ROS                            Anesthesia  Physical Anesthesia Plan  ASA: II  Anesthesia Plan: General LMA   Post-op Pain Management:    Induction:   PONV Risk Score and Plan: Dexamethasone, Ondansetron and Treatment may vary due to age or medical condition  Airway Management Planned:   Additional Equipment:   Intra-op Plan:   Post-operative Plan:   Informed Consent: I have reviewed the patients History and Physical, chart, labs and discussed the procedure including the risks, benefits and alternatives for the proposed anesthesia with the patient or authorized representative who has indicated his/her understanding and acceptance.     Dental Advisory Given  Plan Discussed with: Anesthesiologist and CRNA  Anesthesia Plan Comments:        Anesthesia Quick Evaluation

## 2019-01-25 NOTE — Anesthesia Post-op Follow-up Note (Signed)
Anesthesia QCDR form completed.        

## 2019-01-25 NOTE — Discharge Instructions (Signed)
AMBULATORY SURGERY  DISCHARGE INSTRUCTIONS   1) The drugs that you were given will stay in your system until tomorrow so for the next 24 hours you should not:  A) Drive an automobile B) Make any legal decisions C) Drink any alcoholic beverage   2) You may resume regular meals tomorrow.  Today it is better to start with liquids and gradually work up to solid foods.  You may eat anything you prefer, but it is better to start with liquids, then soup and crackers, and gradually work up to solid foods.   3) Please notify your doctor immediately if you have any unusual bleeding, trouble breathing, redness and pain at the surgery site, drainage, fever, or pain not relieved by medication. 4)   5) Your post-operative visit with Dr.                                     is: Date:                        Time:    Please call to schedule your post-operative visit.  6) Additional Instructions:      Ice pack Elevation RTC 4 weeks Resume PT

## 2019-01-25 NOTE — Anesthesia Procedure Notes (Signed)
Procedure Name: LMA Insertion Date/Time: 01/25/2019 9:43 AM Performed by: Lavone Orn, CRNA Pre-anesthesia Checklist: Patient identified, Emergency Drugs available, Suction available, Patient being monitored and Timeout performed Patient Re-evaluated:Patient Re-evaluated prior to induction Oxygen Delivery Method: Circle system utilized Preoxygenation: Pre-oxygenation with 100% oxygen Induction Type: IV induction Ventilation: Mask ventilation without difficulty LMA: LMA inserted LMA Size: 4.0 Number of attempts: 1 Placement Confirmation: positive ETCO2 and breath sounds checked- equal and bilateral Tube secured with: Tape Dental Injury: Teeth and Oropharynx as per pre-operative assessment

## 2019-01-25 NOTE — Transfer of Care (Signed)
Immediate Anesthesia Transfer of Care Note  Patient: Roger Watson  Procedure(s) Performed: CLOSED MANIPULATION KNEE LEFT (Left )  Patient Location: PACU  Anesthesia Type:General  Level of Consciousness: awake and drowsy  Airway & Oxygen Therapy: Patient Spontanous Breathing and Patient connected to nasal cannula oxygen  Post-op Assessment: Report given to RN and Post -op Vital signs reviewed and stable  Post vital signs: stable  Last Vitals:  Vitals Value Taken Time  BP 125/82 01/25/2019 10:07 AM  Temp 36.3 C 01/25/2019 10:07 AM  Pulse 65 01/25/2019 10:11 AM  Resp 9 01/25/2019 10:10 AM  SpO2 99 % 01/25/2019 10:11 AM  Vitals shown include unvalidated device data.  Last Pain:  Vitals:   01/25/19 0803  TempSrc: Tympanic  PainSc: 0-No pain         Complications: No apparent anesthesia complications

## 2019-01-25 NOTE — H&P (Signed)
  H and P reviewed. No changes. Uploaded at later date. 

## 2019-01-26 ENCOUNTER — Encounter: Payer: Self-pay | Admitting: Unknown Physician Specialty

## 2019-01-26 NOTE — Anesthesia Postprocedure Evaluation (Signed)
Anesthesia Post Note  Patient: Roger Watson  Procedure(s) Performed: CLOSED MANIPULATION KNEE LEFT (Left )  Patient location during evaluation: PACU Anesthesia Type: General Level of consciousness: awake and alert Pain management: pain level controlled Vital Signs Assessment: post-procedure vital signs reviewed and stable Respiratory status: spontaneous breathing, nonlabored ventilation and respiratory function stable Cardiovascular status: blood pressure returned to baseline and stable Postop Assessment: no apparent nausea or vomiting Anesthetic complications: no     Last Vitals:  Vitals:   01/25/19 1040 01/25/19 1048  BP: 132/87 (!) 146/94  Pulse: 66 67  Resp: 20 16  Temp:  (!) 36.1 C  SpO2: 96% 98%    Last Pain:  Vitals:   01/25/19 1048  TempSrc: Tympanic  PainSc: 0-No pain                 Durenda Hurt

## 2019-11-28 ENCOUNTER — Other Ambulatory Visit: Payer: Self-pay

## 2019-11-28 ENCOUNTER — Encounter: Payer: Self-pay | Admitting: Ophthalmology

## 2019-12-04 ENCOUNTER — Other Ambulatory Visit
Admission: RE | Admit: 2019-12-04 | Discharge: 2019-12-04 | Disposition: A | Discharge status: Home / Self Care | Payer: Medicare PPO | Source: Ambulatory Visit | Attending: Ophthalmology | Admitting: Ophthalmology

## 2019-12-04 DIAGNOSIS — Z01812 Encounter for preprocedural laboratory examination: Secondary | ICD-10-CM | POA: Insufficient documentation

## 2019-12-04 DIAGNOSIS — Z20822 Contact with and (suspected) exposure to covid-19: Secondary | ICD-10-CM | POA: Insufficient documentation

## 2019-12-04 LAB — SARS CORONAVIRUS 2 (TAT 6-24 HRS): SARS Coronavirus 2: NEGATIVE

## 2019-12-05 NOTE — Discharge Instructions (Signed)

## 2019-12-06 ENCOUNTER — Encounter: Payer: Self-pay | Admitting: Ophthalmology

## 2019-12-06 ENCOUNTER — Encounter
Admission: RE | Disposition: A | Discharge status: Home / Self Care | Payer: Self-pay | Source: Home / Self Care | Attending: Ophthalmology

## 2019-12-06 ENCOUNTER — Ambulatory Visit: Payer: Medicare PPO | Admitting: Anesthesiology

## 2019-12-06 ENCOUNTER — Ambulatory Visit
Admission: RE | Admit: 2019-12-06 | Discharge: 2019-12-06 | Disposition: A | Discharge status: Home / Self Care | Payer: Medicare PPO | Attending: Ophthalmology | Admitting: Ophthalmology

## 2019-12-06 ENCOUNTER — Other Ambulatory Visit: Payer: Self-pay

## 2019-12-06 DIAGNOSIS — I341 Nonrheumatic mitral (valve) prolapse: Secondary | ICD-10-CM | POA: Diagnosis not present

## 2019-12-06 DIAGNOSIS — Z96659 Presence of unspecified artificial knee joint: Secondary | ICD-10-CM | POA: Insufficient documentation

## 2019-12-06 DIAGNOSIS — Z87891 Personal history of nicotine dependence: Secondary | ICD-10-CM | POA: Diagnosis not present

## 2019-12-06 DIAGNOSIS — Z881 Allergy status to other antibiotic agents status: Secondary | ICD-10-CM | POA: Insufficient documentation

## 2019-12-06 DIAGNOSIS — I10 Essential (primary) hypertension: Secondary | ICD-10-CM | POA: Diagnosis not present

## 2019-12-06 DIAGNOSIS — H409 Unspecified glaucoma: Secondary | ICD-10-CM | POA: Insufficient documentation

## 2019-12-06 DIAGNOSIS — M1990 Unspecified osteoarthritis, unspecified site: Secondary | ICD-10-CM | POA: Diagnosis not present

## 2019-12-06 DIAGNOSIS — H2511 Age-related nuclear cataract, right eye: Secondary | ICD-10-CM | POA: Insufficient documentation

## 2019-12-06 DIAGNOSIS — K219 Gastro-esophageal reflux disease without esophagitis: Secondary | ICD-10-CM | POA: Insufficient documentation

## 2019-12-06 DIAGNOSIS — Z882 Allergy status to sulfonamides status: Secondary | ICD-10-CM | POA: Insufficient documentation

## 2019-12-06 DIAGNOSIS — F329 Major depressive disorder, single episode, unspecified: Secondary | ICD-10-CM | POA: Insufficient documentation

## 2019-12-06 DIAGNOSIS — H5703 Miosis: Secondary | ICD-10-CM | POA: Diagnosis not present

## 2019-12-06 HISTORY — DX: Presence of dental prosthetic device (complete) (partial): Z97.2

## 2019-12-06 HISTORY — PX: CATARACT EXTRACTION W/PHACO: SHX586

## 2019-12-06 SURGERY — PHACOEMULSIFICATION, CATARACT, WITH IOL INSERTION
Anesthesia: Monitor Anesthesia Care | Site: Eye | Laterality: Right

## 2019-12-06 MED ORDER — EPINEPHRINE PF 1 MG/ML IJ SOLN
INTRAOCULAR | Status: DC | PRN
  Administered 2019-12-06: 57 mL via OPHTHALMIC

## 2019-12-06 MED ORDER — FENTANYL CITRATE (PF) 100 MCG/2ML IJ SOLN
INTRAMUSCULAR | Status: DC | PRN
  Administered 2019-12-06: 50 ug via INTRAVENOUS

## 2019-12-06 MED ORDER — TETRACAINE HCL 0.5 % OP SOLN
1.0000 [drp] | OPHTHALMIC | Status: DC | PRN
  Administered 2019-12-06 (×3): 1 [drp] via OPHTHALMIC

## 2019-12-06 MED ORDER — BRIMONIDINE TARTRATE-TIMOLOL 0.2-0.5 % OP SOLN
OPHTHALMIC | Status: DC | PRN
  Administered 2019-12-06: 1 [drp] via OPHTHALMIC

## 2019-12-06 MED ORDER — OXYCODONE HCL 5 MG/5ML PO SOLN: 5.0000 mg | Freq: Once | ORAL | Status: DC | PRN

## 2019-12-06 MED ORDER — OXYCODONE HCL 5 MG PO TABS: 5.0000 mg | ORAL_TABLET | Freq: Once | ORAL | Status: DC | PRN

## 2019-12-06 MED ORDER — NA HYALUR & NA CHOND-NA HYALUR 0.4-0.35 ML IO KIT
PACK | INTRAOCULAR | Status: DC | PRN
  Administered 2019-12-06: 1 mL via INTRAOCULAR

## 2019-12-06 MED ORDER — LACTATED RINGERS IV SOLN: 100.0000 mL/h | INTRAVENOUS | Status: DC

## 2019-12-06 MED ORDER — ARMC OPHTHALMIC DILATING DROPS
1.0000 "application " | OPHTHALMIC | Status: DC | PRN
  Administered 2019-12-06 (×3): 1 via OPHTHALMIC

## 2019-12-06 MED ORDER — MOXIFLOXACIN HCL 0.5 % OP SOLN
1.0000 [drp] | OPHTHALMIC | Status: DC | PRN
  Administered 2019-12-06 (×3): 1 [drp] via OPHTHALMIC

## 2019-12-06 MED ORDER — LACTATED RINGERS IV SOLN: INTRAVENOUS | Status: DC

## 2019-12-06 MED ORDER — LIDOCAINE HCL (PF) 2 % IJ SOLN
INTRAOCULAR | Status: DC | PRN
  Administered 2019-12-06: 1 mL

## 2019-12-06 MED ORDER — MIDAZOLAM HCL 2 MG/2ML IJ SOLN
INTRAMUSCULAR | Status: DC | PRN
  Administered 2019-12-06: 1 mg via INTRAVENOUS

## 2019-12-06 MED ORDER — CEFUROXIME OPHTHALMIC INJECTION 1 MG/0.1 ML
INJECTION | OPHTHALMIC | Status: DC | PRN
  Administered 2019-12-06: 0.1 mL via INTRACAMERAL

## 2019-12-06 SURGICAL SUPPLY — 17 items
CANNULA ANT/CHMB 27G (MISCELLANEOUS) ×1 IMPLANT
CANNULA ANT/CHMB 27GA (MISCELLANEOUS) ×3 IMPLANT
GLOVE SURG LX 7.5 STRW (GLOVE) ×2
GLOVE SURG LX STRL 7.5 STRW (GLOVE) ×1 IMPLANT
GLOVE SURG TRIUMPH 8.0 PF LTX (GLOVE) ×3 IMPLANT
GOWN STRL REUS W/ TWL LRG LVL3 (GOWN DISPOSABLE) ×2 IMPLANT
GOWN STRL REUS W/TWL LRG LVL3 (GOWN DISPOSABLE) ×4
LENS IOL TECNIS ITEC 21.0 (Intraocular Lens) ×2 IMPLANT
MARKER SKIN DUAL TIP RULER LAB (MISCELLANEOUS) ×3 IMPLANT
PACK CATARACT BRASINGTON (MISCELLANEOUS) ×3 IMPLANT
PACK EYE AFTER SURG (MISCELLANEOUS) ×3 IMPLANT
PACK OPTHALMIC (MISCELLANEOUS) ×3 IMPLANT
RING MALYGIN 7.0 (MISCELLANEOUS) ×2 IMPLANT
SYR 3ML LL SCALE MARK (SYRINGE) ×3 IMPLANT
SYR TB 1ML LUER SLIP (SYRINGE) ×3 IMPLANT
WATER STERILE IRR 500ML POUR (IV SOLUTION) ×3 IMPLANT
WIPE NON LINTING 3.25X3.25 (MISCELLANEOUS) ×3 IMPLANT

## 2019-12-06 NOTE — Anesthesia Preprocedure Evaluation (Addendum)
Anesthesia Evaluation  Patient identified by MRN, date of birth, ID band Patient awake    Reviewed: NPO status   History of Anesthesia Complications Negative for: history of anesthetic complications  Airway Mallampati: II  TM Distance: >3 FB Neck ROM: full    Dental  (+) Upper Dentures   Pulmonary neg pulmonary ROS, former smoker,    Pulmonary exam normal        Cardiovascular Exercise Tolerance: Good hypertension, Normal cardiovascular exam+ Valvular Problems/Murmurs MVP      Neuro/Psych Anxiety Depression Glaucoma     GI/Hepatic Neg liver ROS, GERD  Controlled,ibs   Endo/Other  negative endocrine ROS  Renal/GU negative Renal ROS   bph    Musculoskeletal  (+) Arthritis ,   Abdominal   Peds  Hematology negative hematology ROS (+)   Anesthesia Other Findings SARS Coronavirus  NEGATIVE.  pcp note: stable: nov 2020: dr. Wynetta Emery.  Reproductive/Obstetrics                            Anesthesia Physical Anesthesia Plan  ASA: II  Anesthesia Plan: MAC   Post-op Pain Management:    Induction:   PONV Risk Score and Plan: 1 and Midazolam  Airway Management Planned:   Additional Equipment:   Intra-op Plan:   Post-operative Plan:   Informed Consent: I have reviewed the patients History and Physical, chart, labs and discussed the procedure including the risks, benefits and alternatives for the proposed anesthesia with the patient or authorized representative who has indicated his/her understanding and acceptance.       Plan Discussed with: CRNA  Anesthesia Plan Comments:         Anesthesia Quick Evaluation

## 2019-12-06 NOTE — Anesthesia Postprocedure Evaluation (Signed)
Anesthesia Post Note  Patient: Roger Watson  Procedure(s) Performed: CATARACT EXTRACTION PHACO AND INTRAOCULAR LENS PLACEMENT (IOC) RIGHT MALYUGIN 9.42  01:05.8  14.3% (Right Eye)     Patient location during evaluation: PACU Anesthesia Type: MAC Level of consciousness: awake and alert Pain management: pain level controlled Vital Signs Assessment: post-procedure vital signs reviewed and stable Respiratory status: spontaneous breathing, nonlabored ventilation, respiratory function stable and patient connected to nasal cannula oxygen Cardiovascular status: stable and blood pressure returned to baseline Postop Assessment: no apparent nausea or vomiting Anesthetic complications: no    Edward Trevino

## 2019-12-06 NOTE — Transfer of Care (Signed)
Immediate Anesthesia Transfer of Care Note  Patient: Roger Watson Begin  Procedure(s) Performed: CATARACT EXTRACTION PHACO AND INTRAOCULAR LENS PLACEMENT (IOC) RIGHT MALYUGIN 9.42  01:05.8  14.3% (Right Eye)  Patient Location: PACU  Anesthesia Type: MAC  Level of Consciousness: awake, alert  and patient cooperative  Airway and Oxygen Therapy: Patient Spontanous Breathing and Patient connected to supplemental oxygen  Post-op Assessment: Post-op Vital signs reviewed, Patient's Cardiovascular Status Stable, Respiratory Function Stable, Patent Airway and No signs of Nausea or vomiting  Post-op Vital Signs: Reviewed and stable  Complications: No apparent anesthesia complications

## 2019-12-06 NOTE — Op Note (Signed)
OPERATIVE NOTE  WILFORD STRAYER MR:[id number] 12/06/2019   PREOPERATIVE DIAGNOSIS:    Nuclear Sclerotic Cataract Right eye with miotic pupil.        H25.11  POSTOPERATIVE DIAGNOSIS: Nuclear Sclerotic Cataract Right eye with miotic pupil.          PROCEDURE:  Phacoemusification with posterior chamber intraocular lens placement of the right eye which required pupil stretching with the Malyugin pupil expansion device. Ultrasound time: Procedure(s): CATARACT EXTRACTION PHACO AND INTRAOCULAR LENS PLACEMENT (IOC) RIGHT MALYUGIN 9.42  01:05.8  14.3% (Right)  LENS:   Implant Name Type Inv. Item Serial No. Manufacturer Lot No. LRB No. Used Action  LENS IOL DIOP 21.0 - [id number] Intraocular Lens LENS IOL DIOP 21.0 [id number]  Right 1 Implanted       SURGEON:  Wyonia Hough, MD   ANESTHESIA:  Topical with tetracaine drops and 2% Xylocaine jelly, augmented with 1% preservative-free intracameral lidocaine.   COMPLICATIONS:  None.   DESCRIPTION OF PROCEDURE:  The patient was identified in the holding room and transported to the operating room and placed in the supine position under the operating microscope. Theright eye was identified as the operative eye and it was prepped and draped in the usual sterile ophthalmic fashion.   A 1 millimeter clear-corneal paracentesis was made at the 12:00 position.  0.5 ml of preservative-free 1% lidocaine was injected into the anterior chamber. The anterior chamber was filled with Viscoat viscoelastic.  A 2.4 millimeter keratome was used to make a near-clear corneal incision at the 9:00 position. A Malyugin pupil expander was then placed through the main incision and into the anterior chamber of the eye.  The edge of the iris was secured on the lip of the pupil expander and it was released, thereby expanding the pupil to approximately 7 millimeters for completion of the cataract surgery.  Additional Viscoat was placed in the anterior chamber.  A cystotome  and capsulorrhexis forceps were used to make a curvilinear capsulorrhexis.   Balanced salt solution was used to hydrodissect and hydrodelineate the lens nucleus.   Phacoemulsification was used in stop and chop fashion to remove the lens, nucleus and epinucleus.  The remaining cortex was aspirated using the irrigation aspiration handpiece.  Additional Provisc was placed into the eye to distend the capsular bag for lens placement.  A lens was then injected into the capsular bag.  The pupil expanding ring was removed using a Kuglen hook and insertion device. The remaining viscoelastic was aspirated from the capsular bag and the anterior chamber.  The anterior chamber was filled with balanced salt solution to inflate to a physiologic pressure.  Wounds were hydrated with balanced salt solution.  The anterior chamber was inflated to a physiologic pressure with balanced salt solution.  No wound leaks were noted.Cefuroxime 0.1 ml of a 10mg /ml solution was injected into the anterior chamber for a dose of 1 mg of intracameral antibiotic at the completion of the case. Timolol and Brimonidine drops were applied to the eye.  The patient was taken to the recovery room in stable condition without complications of anesthesia or surgery.  Harlynn Kimbell 12/06/2019, 12:41 PM

## 2019-12-06 NOTE — H&P (Signed)

## 2019-12-06 NOTE — Anesthesia Procedure Notes (Signed)
Procedure Name: MAC Date/Time: 12/06/2019 12:19 PM Performed by: Cameron Ali, CRNA Pre-anesthesia Checklist: Patient identified, Emergency Drugs available, Suction available, Timeout performed and Patient being monitored Patient Re-evaluated:Patient Re-evaluated prior to induction Oxygen Delivery Method: Nasal cannula Placement Confirmation: positive ETCO2

## 2019-12-07 ENCOUNTER — Encounter: Payer: Self-pay | Admitting: *Deleted

## 2019-12-19 IMAGING — US US ABDOMEN LIMITED
1 series · 14 of 25 positions shown · non-contrast
Comparison: Right upper quadrant ultrasound December 22, 2016

CLINICAL DATA: One month history of right upper quadrant pain

EXAM:
ULTRASOUND ABDOMEN LIMITED RIGHT UPPER QUADRANT

[Series 1: us abdomen limited · 0.26mm/px · 14 of 62 slices shown]
[im 1/62]
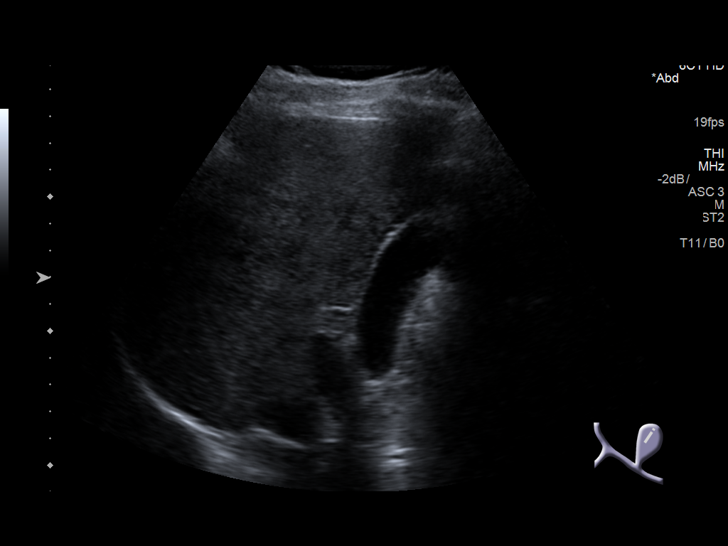
[im 6/62]
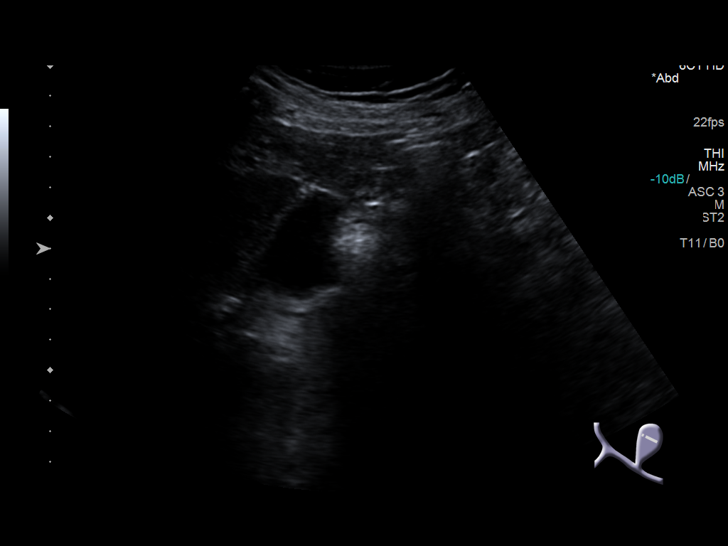
[im 11/62]
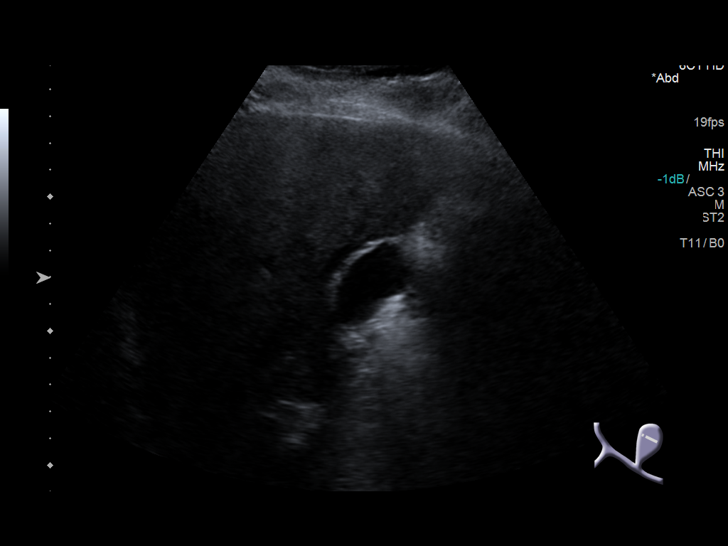
[im 16/62]
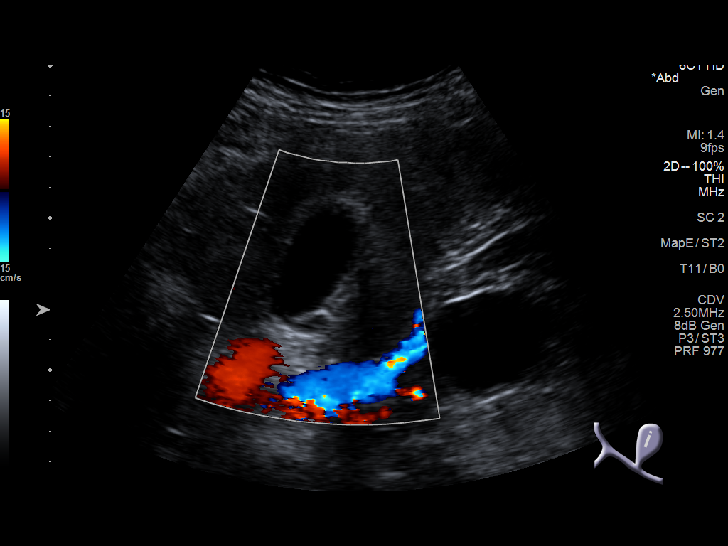
[im 21/62]
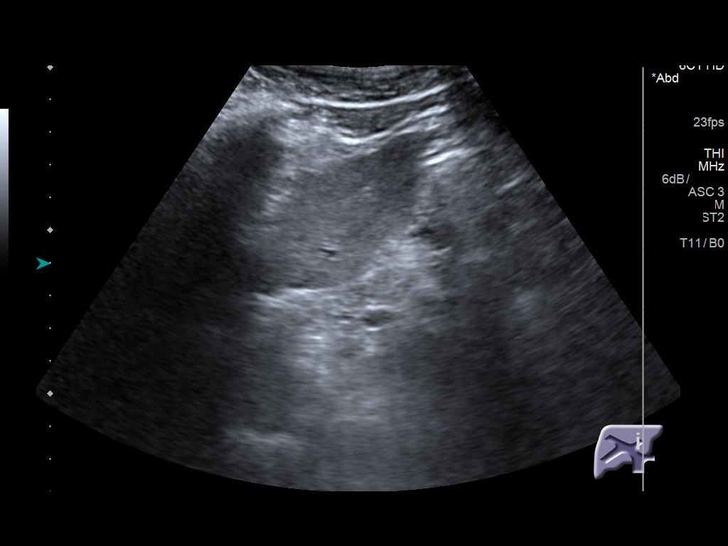
[im 23/62]
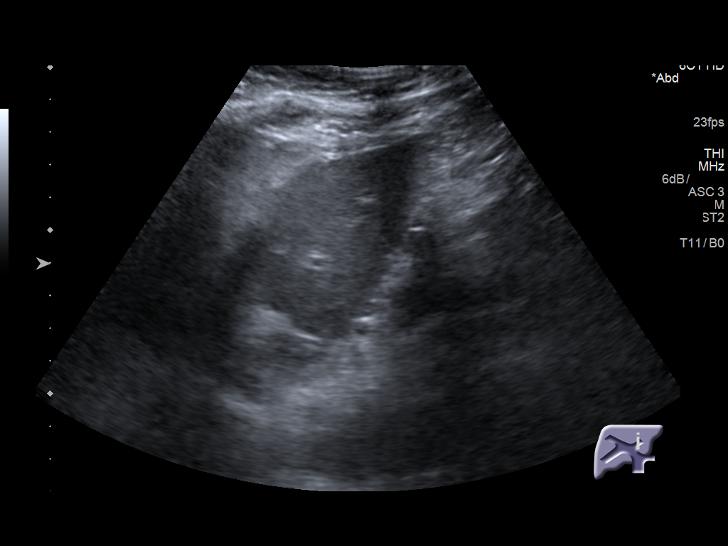
[im 28/62]
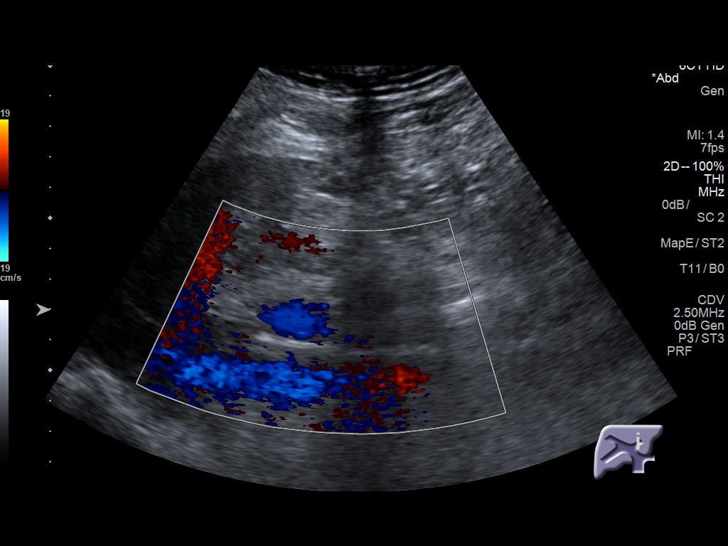
[im 34/62]
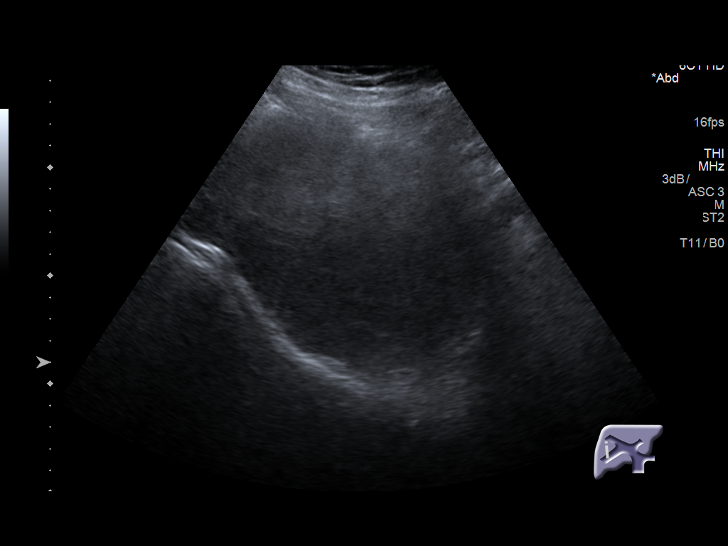
[im 39/62]
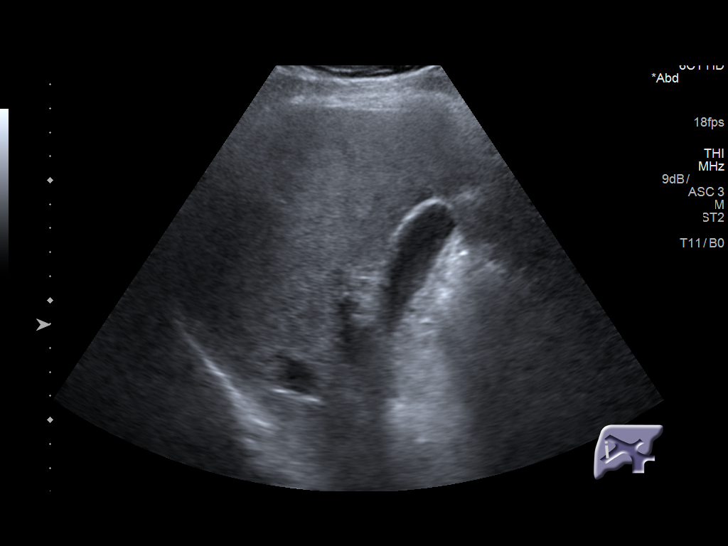
[im 41/62]
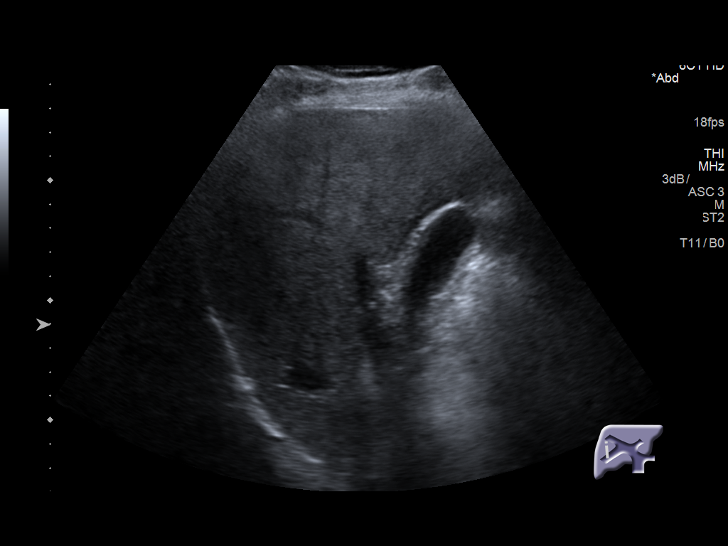
[im 46/62]
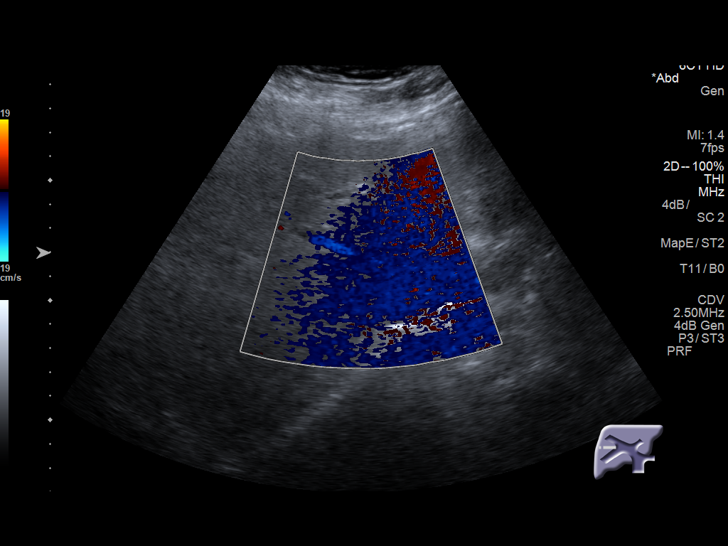
[im 51/62]
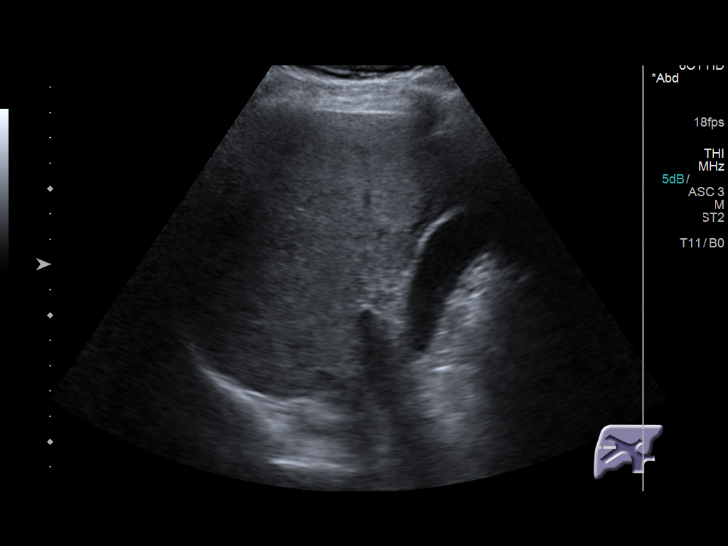
[im 56/62]
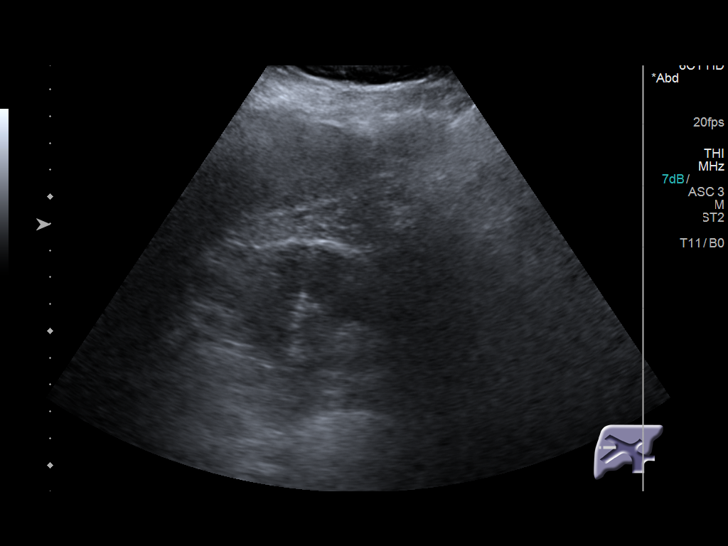
[im 62/62]
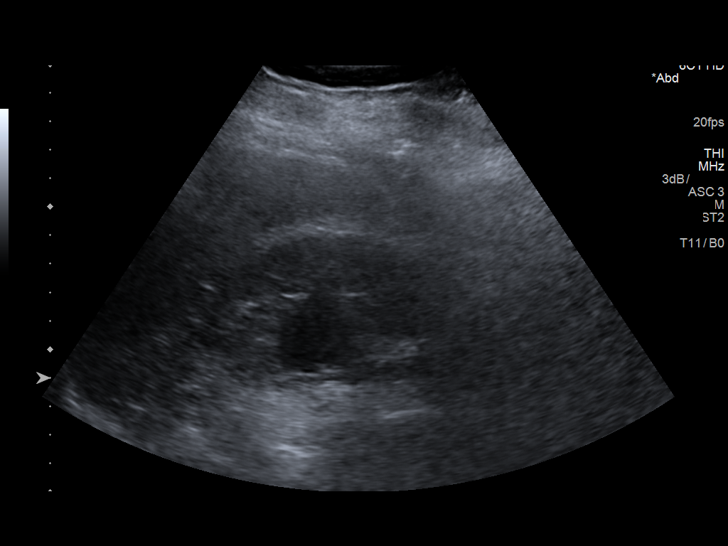

[14 of 25 positions shown; findings below may reference images not displayed]

FINDINGS: Gallbladder:

No gallstones or wall thickening visualized. No sonographic Murphy
sign noted by sonographer.

Common bile duct:

Diameter: 3 mm

Liver:

The hepatic echotexture is mildly increased. The surface contour is
fairly smooth. There is no focal mass nor ductal dilation. Portal
vein is patent on color Doppler imaging with normal direction of
blood flow towards the liver.

Incidental note is made of mild right-sided hydronephrosis.
IMPRESSION: Normal appearing gallbladder and common bile duct. If there are
clinical concerns of chronic gallbladder dysfunction, a nuclear
medicine hepatobiliary scan with gallbladder ejection fraction
determination may be useful.

Increased hepatic echotexture most compatible with fatty
infiltrative change.

Mild right-sided hydronephrosis.

## 2020-03-18 ENCOUNTER — Other Ambulatory Visit: Payer: Self-pay | Admitting: Adult Health

## 2021-01-09 ENCOUNTER — Other Ambulatory Visit
Admission: RE | Admit: 2021-01-09 | Discharge: 2021-01-09 | Disposition: A | Discharge status: Home / Self Care | Payer: Medicare PPO | Source: Ambulatory Visit | Attending: Gastroenterology | Admitting: Gastroenterology

## 2021-01-09 ENCOUNTER — Other Ambulatory Visit: Payer: Self-pay

## 2021-01-09 DIAGNOSIS — Z01812 Encounter for preprocedural laboratory examination: Secondary | ICD-10-CM | POA: Insufficient documentation

## 2021-01-09 DIAGNOSIS — Z20822 Contact with and (suspected) exposure to covid-19: Secondary | ICD-10-CM | POA: Diagnosis not present

## 2021-01-09 LAB — SARS CORONAVIRUS 2 (TAT 6-24 HRS): SARS Coronavirus 2: NEGATIVE

## 2021-01-13 ENCOUNTER — Other Ambulatory Visit: Payer: Self-pay

## 2021-01-13 ENCOUNTER — Ambulatory Visit: Payer: Medicare PPO | Admitting: Certified Registered Nurse Anesthetist

## 2021-01-13 ENCOUNTER — Encounter
Admission: RE | Disposition: A | Discharge status: Home / Self Care | Payer: Self-pay | Source: Ambulatory Visit | Attending: Gastroenterology

## 2021-01-13 ENCOUNTER — Ambulatory Visit
Admission: RE | Admit: 2021-01-13 | Discharge: 2021-01-13 | Disposition: A | Discharge status: Home / Self Care | Payer: Medicare PPO | Source: Ambulatory Visit | Attending: Gastroenterology | Admitting: Gastroenterology

## 2021-01-13 DIAGNOSIS — Z1211 Encounter for screening for malignant neoplasm of colon: Secondary | ICD-10-CM | POA: Insufficient documentation

## 2021-01-13 DIAGNOSIS — Z79899 Other long term (current) drug therapy: Secondary | ICD-10-CM | POA: Insufficient documentation

## 2021-01-13 DIAGNOSIS — Z8371 Family history of colonic polyps: Secondary | ICD-10-CM | POA: Insufficient documentation

## 2021-01-13 DIAGNOSIS — K640 First degree hemorrhoids: Secondary | ICD-10-CM | POA: Insufficient documentation

## 2021-01-13 DIAGNOSIS — K5730 Diverticulosis of large intestine without perforation or abscess without bleeding: Secondary | ICD-10-CM | POA: Insufficient documentation

## 2021-01-13 DIAGNOSIS — Z87891 Personal history of nicotine dependence: Secondary | ICD-10-CM | POA: Diagnosis not present

## 2021-01-13 DIAGNOSIS — Z881 Allergy status to other antibiotic agents status: Secondary | ICD-10-CM | POA: Insufficient documentation

## 2021-01-13 DIAGNOSIS — Z882 Allergy status to sulfonamides status: Secondary | ICD-10-CM | POA: Insufficient documentation

## 2021-01-13 HISTORY — PX: COLONOSCOPY: SHX5424

## 2021-01-13 SURGERY — COLONOSCOPY
Anesthesia: General

## 2021-01-13 MED ORDER — PROPOFOL 500 MG/50ML IV EMUL
INTRAVENOUS | Status: DC | PRN
  Administered 2021-01-13: 160 ug/kg/min via INTRAVENOUS

## 2021-01-13 MED ORDER — PROPOFOL 500 MG/50ML IV EMUL
INTRAVENOUS | Status: AC
  Filled 2021-01-13: qty 100

## 2021-01-13 MED ORDER — PROPOFOL 10 MG/ML IV BOLUS
INTRAVENOUS | Status: DC | PRN
  Administered 2021-01-13: 70 mg via INTRAVENOUS
  Administered 2021-01-13: 30 mg via INTRAVENOUS

## 2021-01-13 MED ORDER — SODIUM CHLORIDE 0.9 % IV SOLN: INTRAVENOUS | Status: DC

## 2021-01-13 NOTE — Interval H&P Note (Signed)
History and Physical Interval Note:  01/13/2021 10:12 AM  Roger Watson  has presented today for surgery, with the diagnosis of FAMILY HX.OF COLON POLYPS.  The various methods of treatment have been discussed with the patient and family. After consideration of risks, benefits and other options for treatment, the patient has consented to  Procedure(s): COLONOSCOPY (N/A) as a surgical intervention.  [name] history has been reviewed, patient examined, no change in status, stable for surgery.  I have reviewed the patient's chart and labs.  Questions were answered to the patient's satisfaction.     Lesly Rubenstein  Ok to proceed with colonoscopy

## 2021-01-13 NOTE — Op Note (Signed)
[facility] Gastroenterology Patient Name: Roger Watson Procedure Date: 01/13/2021 9:50 AM MRN: [id number] Account #: [id number] Date of Birth: 03-22-1946 Admit Type: Outpatient Age: 75 Room: [facility] ENDO ROOM 3 Gender: Male Note Status: Finalized Procedure:             Colonoscopy Indications:           Colon cancer screening in patient at increased risk:                         Family history of 1st-degree relative with colon polyps Providers:             Andrey Farmer MD, MD Referring MD:          Baxter Hire, MD (Referring MD) Medicines:             Monitored Anesthesia Care Complications:         No immediate complications. Procedure:             Pre-Anesthesia Assessment:                        - Prior to the procedure, a History and Physical was                         performed, and patient medications and allergies were                         reviewed. The patient is competent. The risks and                         benefits of the procedure and the sedation options and                         risks were discussed with the patient. All questions                         were answered and informed consent was obtained.                         Patient identification and proposed procedure were                         verified by the physician, the nurse, the anesthetist                         and the technician in the endoscopy suite. Mental                         Status Examination: alert and oriented. Airway                         Examination: normal oropharyngeal airway and neck                         mobility. Respiratory Examination: clear to                         auscultation. CV Examination: normal. Prophylactic  Antibiotics: The patient does not require prophylactic                         antibiotics. Prior Anticoagulants: The patient has                         taken no previous anticoagulant or antiplatelet                          agents. ASA Grade Assessment: II - A patient with mild                         systemic disease. After reviewing the risks and                         benefits, the patient was deemed in satisfactory                         condition to undergo the procedure. The anesthesia                         plan was to use monitored anesthesia care (MAC).                         Immediately prior to administration of medications,                         the patient was re-assessed for adequacy to receive                         sedatives. The heart rate, respiratory rate, oxygen                         saturations, blood pressure, adequacy of pulmonary                         ventilation, and response to care were monitored                         throughout the procedure. The physical status of the                         patient was re-assessed after the procedure.                        After obtaining informed consent, the colonoscope was                         passed under direct vision. Throughout the procedure,                         the patient's blood pressure, pulse, and oxygen                         saturations were monitored continuously. The                         Colonoscope was introduced through the anus and  advanced to the the cecum, identified by appendiceal                         orifice and ileocecal valve. The colonoscopy was                         performed without difficulty. The patient tolerated                         the procedure well. The quality of the bowel                         preparation was fair. Findings:      The perianal and digital rectal examinations were normal.      Many small and large-mouthed diverticula were found in the sigmoid       colon, descending colon, transverse colon and ascending colon.      Internal hemorrhoids were found during retroflexion. The hemorrhoids       were Grade I (internal  hemorrhoids that do not prolapse).      The exam was otherwise without abnormality on direct and retroflexion       views. Impression:            - Preparation of the colon was fair.                        - Diverticulosis in the sigmoid colon, in the                         descending colon, in the transverse colon and in the                         ascending colon.                        - Internal hemorrhoids.                        - The examination was otherwise normal on direct and                         retroflexion views.                        - No specimens collected. Recommendation:        - Discharge patient to home.                        - Resume previous diet.                        - Continue present medications.                        - Repeat colonoscopy in 1 year because the bowel                         preparation was suboptimal.                        - Return to referring physician as previously  scheduled. Procedure Code(s):     --- Professional ---                        T5974, Colorectal cancer screening; colonoscopy on                         individual at high risk Diagnosis Code(s):     --- Professional ---                        Z83.71, Family history of colonic polyps                        K64.0, First degree hemorrhoids                        K57.30, Diverticulosis of large intestine without                         perforation or abscess without bleeding CPT copyright 2019 [employer]. All rights reserved. The codes documented in this report are preliminary and upon coder review may  be revised to meet current compliance requirements. Andrey Farmer MD, MD 01/13/2021 10:51:16 AM Number of Addenda: 0 Note Initiated On: 01/13/2021 9:50 AM Scope Withdrawal Time: 0 hours 7 minutes 6 seconds  Total Procedure Duration: 0 hours 17 minutes 3 seconds  Estimated Blood Loss:  Estimated blood loss: none.      [facility]

## 2021-01-13 NOTE — Anesthesia Preprocedure Evaluation (Signed)
Anesthesia Evaluation  Patient identified by MRN, date of birth, ID band Patient awake    Reviewed: NPO status   History of Anesthesia Complications Negative for: history of anesthetic complications  Airway Mallampati: II  TM Distance: >3 FB Neck ROM: full    Dental  (+) Upper Dentures   Pulmonary neg pulmonary ROS, former smoker,    Pulmonary exam normal breath sounds clear to auscultation       Cardiovascular Exercise Tolerance: Good hypertension, Normal cardiovascular exam+ Valvular Problems/Murmurs MVP  Rhythm:Regular Rate:Normal     Neuro/Psych PSYCHIATRIC DISORDERS Anxiety Depression Glaucoma     GI/Hepatic Neg liver ROS, GERD  Controlled,ibs   Endo/Other  negative endocrine ROS  Renal/GU negative Renal ROS   bph    Musculoskeletal  (+) Arthritis ,   Abdominal   Peds  Hematology negative hematology ROS (+)   Anesthesia Other Findings Past Medical History: 02/05/2016: Bony exostosis 02/05/2016: Capsulitis of left foot No date: Dental bridge present     Comment:  permanent - top right 09/01/2012: Enlarged prostate with lower urinary tract symptoms (LUTS) 02/08/2017: Essential hypertension No date: GERD (gastroesophageal reflux disease) 02/05/2016: Hammertoe 12/18/2014: IBS (irritable bowel syndrome) 10/19/2016: MVP (mitral valve prolapse) 03/17/2017: Primary osteoarthritis of left knee No date: Wears partial dentures     Comment:  upper left   Reproductive/Obstetrics                             Anesthesia Physical  Anesthesia Plan  ASA: II  Anesthesia Plan: General   Post-op Pain Management:    Induction: Intravenous  PONV Risk Score and Plan: 2 and Propofol infusion and Ondansetron  Airway Management Planned: Nasal Cannula  Additional Equipment: None  Intra-op Plan:   Post-operative Plan:   Informed Consent: I have reviewed the patients History and Physical,  chart, labs and discussed the procedure including the risks, benefits and alternatives for the proposed anesthesia with the patient or authorized representative who has indicated his/her understanding and acceptance.     Dental advisory given  Plan Discussed with: CRNA  Anesthesia Plan Comments: (Discussed risks of anesthesia with patient, including possibility of difficulty with spontaneous ventilation under anesthesia necessitating airway intervention, PONV, and rare risks such as cardiac or respiratory or neurological events. Patient understands.)        Anesthesia Quick Evaluation

## 2021-01-13 NOTE — H&P (Signed)
Outpatient short stay form Pre-procedure 01/13/2021 10:10 AM Raylene Miyamoto MD, MPH  Primary Physician: Dr. Edwina Barth  Reason for visit:  Screening  History of present illness:   75 y/o gentleman with family history of polyps here for screening colonoscopy. No family history of GI malignancies. No blood thinners. No significant GI symptoms.    Current Facility-Administered Medications:    0.9 %  sodium chloride infusion, , Intravenous, Continuous, Lyonel Morejon, Hilton Cork, MD  Medications Prior to Admission  Medication Sig Dispense Refill Last Dose   citalopram (CELEXA) 20 MG tablet Take 1 tablet (20 mg total) by mouth daily. 30 tablet 0 01/12/2021 at Unknown time   doxazosin (CARDURA) 4 MG tablet Take 4 mg by mouth daily.    01/12/2021 at Unknown time   pantoprazole (PROTONIX) 40 MG tablet Take 1 tablet (40 mg total) by mouth daily. 30 tablet 0 01/12/2021 at Unknown time   acetaminophen (TYLENOL) 500 MG tablet Take 1,000 mg by mouth every 8 (eight) hours as needed (pain/headaches.).       Glucosamine-Chondroitin (COSAMIN DS PO) Take 1 tablet by mouth daily.      ketoconazole (NIZORAL) 2 % shampoo Apply 1 application topically daily.       latanoprost (XALATAN) 0.005 % ophthalmic solution Place 1 drop into both eyes at bedtime. 2.5 mL 0    meloxicam (MOBIC) 15 MG tablet Take 15 mg by mouth daily as needed for pain.      Multiple Vitamin (MULTIVITAMIN WITH MINERALS) TABS tablet Take 1 tablet by mouth daily.      oxycodone (OXY-IR) 5 MG capsule Take 5 mg by mouth every 4 (four) hours as needed.      traMADol (ULTRAM) 50 MG tablet Take 1 tablet (50 mg total) by mouth every 6 (six) hours as needed. 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    Physical Exam  Gen: Alert, oriented. Appears stated age.  HEENT: PERRLA. Lungs: No respiratory distress CV: RRR Abd: soft, benign, no masses Ext: No edema    Planned procedures: Proceed with colonoscopy. The patient understands the nature of the planned procedure, indications, risks, alternatives and potential complications including but not limited to bleeding, infection, perforation, damage to internal organs and possible oversedation/side effects from anesthesia. The patient agrees and gives consent to proceed.  Please refer to procedure notes for findings, recommendations and patient disposition/instructions.     Raylene Miyamoto MD, MPH Gastroenterology 01/13/2021  10:10 AM

## 2021-01-13 NOTE — Transfer of Care (Signed)
Immediate Anesthesia Transfer of Care Note  Patient: Roger Watson  Procedure(s) Performed: COLONOSCOPY (N/A )  Patient Location: PACU  Anesthesia Type:General  Level of Consciousness: awake and alert   Airway & Oxygen Therapy: Patient Spontanous Breathing and Patient connected to nasal cannula oxygen  Post-op Assessment: Report given to RN and Post -op Vital signs reviewed and stable  Post vital signs: Reviewed and stable  Last Vitals:  Vitals Value Taken Time  BP 125/88 01/13/21 1046  Temp 36.4 C 01/13/21 1046  Pulse 83 01/13/21 1047  Resp 21 01/13/21 1047  SpO2 95 % 01/13/21 1047  Vitals shown include unvalidated device data.  Last Pain:  Vitals:   01/13/21 1046  TempSrc: Temporal  PainSc: 0-No pain         Complications: No complications documented.

## 2021-01-13 NOTE — Anesthesia Postprocedure Evaluation (Signed)
Anesthesia Post Note  Patient: Roger Watson  Procedure(s) Performed: COLONOSCOPY (N/A )  Patient location during evaluation: Endoscopy Anesthesia Type: General Level of consciousness: awake and alert Pain management: pain level controlled Vital Signs Assessment: post-procedure vital signs reviewed and stable Respiratory status: spontaneous breathing, nonlabored ventilation, respiratory function stable and patient connected to nasal cannula oxygen Cardiovascular status: blood pressure returned to baseline and stable Postop Assessment: no apparent nausea or vomiting Anesthetic complications: no   No complications documented.   Last Vitals:  Vitals:   01/13/21 1106 01/13/21 1116  BP: (!) 142/94 (!) 141/87  Pulse: 73 75  Resp: (!) 21 (!) 22  Temp:    SpO2: 96% 96%    Last Pain:  Vitals:   01/13/21 1116  TempSrc:   PainSc: 0-No pain                 Arita Miss

## 2021-01-16 ENCOUNTER — Encounter: Payer: Self-pay | Admitting: Gastroenterology

## 2021-03-14 ENCOUNTER — Other Ambulatory Visit: Payer: Self-pay | Admitting: Family Medicine

## 2021-03-14 ENCOUNTER — Other Ambulatory Visit (HOSPITAL_COMMUNITY): Payer: Self-pay | Admitting: Family Medicine

## 2021-03-14 DIAGNOSIS — M545 Low back pain, unspecified: Secondary | ICD-10-CM

## 2021-03-28 ENCOUNTER — Ambulatory Visit: Payer: Medicare PPO

## 2021-05-26 ENCOUNTER — Other Ambulatory Visit: Payer: Self-pay | Admitting: Family Medicine

## 2021-05-26 DIAGNOSIS — W1800XA Striking against unspecified object with subsequent fall, initial encounter: Secondary | ICD-10-CM

## 2021-05-26 DIAGNOSIS — S0990XA Unspecified injury of head, initial encounter: Secondary | ICD-10-CM

## 2021-05-27 ENCOUNTER — Ambulatory Visit
Admission: RE | Admit: 2021-05-27 | Discharge: 2021-05-27 | Disposition: A | Discharge status: Home / Self Care | Payer: Medicare PPO | Source: Ambulatory Visit | Attending: Family Medicine | Admitting: Family Medicine

## 2021-05-27 ENCOUNTER — Other Ambulatory Visit: Payer: Self-pay

## 2021-05-27 DIAGNOSIS — S0990XA Unspecified injury of head, initial encounter: Secondary | ICD-10-CM | POA: Diagnosis present

## 2021-05-27 DIAGNOSIS — Y929 Unspecified place or not applicable: Secondary | ICD-10-CM | POA: Diagnosis not present

## 2021-05-27 DIAGNOSIS — W1800XA Striking against unspecified object with subsequent fall, initial encounter: Secondary | ICD-10-CM | POA: Diagnosis not present

## 2021-05-27 DIAGNOSIS — Y939 Activity, unspecified: Secondary | ICD-10-CM | POA: Diagnosis not present

## 2021-05-28 ENCOUNTER — Ambulatory Visit: Payer: Medicare PPO

## 2023-08-09 ENCOUNTER — Emergency Department
Admission: EM | Admit: 2023-08-09 | Discharge: 2023-08-09 | Disposition: A | Discharge status: Home / Self Care | Payer: Medicare PPO | Attending: Emergency Medicine | Admitting: Emergency Medicine

## 2023-08-09 ENCOUNTER — Emergency Department: Payer: Medicare PPO

## 2023-08-09 DIAGNOSIS — S92321A Displaced fracture of second metatarsal bone, right foot, initial encounter for closed fracture: Secondary | ICD-10-CM | POA: Diagnosis not present

## 2023-08-09 DIAGNOSIS — S92511A Displaced fracture of proximal phalanx of right lesser toe(s), initial encounter for closed fracture: Secondary | ICD-10-CM | POA: Diagnosis not present

## 2023-08-09 DIAGNOSIS — S92811A Other fracture of right foot, initial encounter for closed fracture: Secondary | ICD-10-CM

## 2023-08-09 DIAGNOSIS — Y93E2 Activity, laundry: Secondary | ICD-10-CM | POA: Diagnosis not present

## 2023-08-09 DIAGNOSIS — W1830XA Fall on same level, unspecified, initial encounter: Secondary | ICD-10-CM | POA: Insufficient documentation

## 2023-08-09 DIAGNOSIS — W19XXXA Unspecified fall, initial encounter: Secondary | ICD-10-CM

## 2023-08-09 DIAGNOSIS — M79671 Pain in right foot: Secondary | ICD-10-CM | POA: Diagnosis present

## 2023-08-09 MED ORDER — ONDANSETRON HCL 4 MG/2ML IJ SOLN
4.0000 mg | Freq: Once | INTRAMUSCULAR | Status: AC
  Administered 2023-08-09: 4 mg via INTRAVENOUS
  Filled 2023-08-09: qty 2

## 2023-08-09 MED ORDER — HYDROCODONE-ACETAMINOPHEN 5-325 MG PO TABS: 1.0000 | ORAL_TABLET | Freq: Four times a day (QID) | ORAL | 0 refills | Status: AC | PRN

## 2023-08-09 MED ORDER — SODIUM CHLORIDE 0.9 % IV BOLUS
500.0000 mL | Freq: Once | INTRAVENOUS | Status: AC
  Administered 2023-08-09: 500 mL via INTRAVENOUS

## 2023-08-09 MED ORDER — ACETAMINOPHEN 500 MG PO TABS
1000.0000 mg | ORAL_TABLET | Freq: Once | ORAL | Status: AC
  Administered 2023-08-09: 1000 mg via ORAL
  Filled 2023-08-09: qty 2

## 2023-08-09 MED ORDER — FENTANYL CITRATE PF 50 MCG/ML IJ SOSY
50.0000 ug | PREFILLED_SYRINGE | Freq: Once | INTRAMUSCULAR | Status: AC
  Administered 2023-08-09: 50 ug via INTRAVENOUS
  Filled 2023-08-09: qty 1

## 2023-08-09 NOTE — Discharge Instructions (Addendum)
Please use your walker to keep weight off of that foot until you are cleared by podiatry. Please seek medical attention for any high fevers, chest pain, shortness of breath, change in behavior, persistent vomiting, bloody stool or any other new or concerning symptoms.

## 2023-08-09 NOTE — ED Provider Notes (Signed)
[facility] Provider Note    Event Date/Time   First MD Initiated Contact with Patient 08/09/23 0700     (approximate)   History   Right foot pain   HPI  Roger Watson is a 77 y.o. male who presents to the emergency department today with primary concern for right foot pain after a fall.  The patient states he was doing laundry this morning when he he was turning around and tripped over a trash can.  He is not sure exactly how he injured his right foot but that is where he is having pain.  He has noticed swelling to the foot.  Pain is located throughout his foot and comes up to his ankle.  The patient denies any previous history of injury to that ankle.  Patient denies any other injuries.     Physical Exam   Triage Vital Signs: ED Triage Vitals  Encounter Vitals Group     BP 08/09/23 0647 127/82     Systolic BP Percentile --      Diastolic BP Percentile --      Pulse Rate 08/09/23 0647 96     Resp 08/09/23 0647 16     Temp 08/09/23 0647 97.9 F (36.6 C)     Temp Source 08/09/23 0647 Oral     SpO2 08/09/23 0647 97 %     Weight 08/09/23 0650 140 lb (63.5 kg)     Height 08/09/23 0650 5\' 5"  (1.651 m)     Head Circumference --      Peak Flow --      Pain Score 08/09/23 0650 10     Pain Loc --      Pain Education --      Exclude from Growth Chart --     Most recent vital signs: Vitals:   08/09/23 0647  BP: 127/82  Pulse: 96  Resp: 16  Temp: 97.9 F (36.6 C)  SpO2: 97%   General: Awake, alert, oriented. CV:  Good peripheral perfusion. Regular rate and rhythm. Resp:  Normal effort. Lungs clear. Abd:  No distention.  Other:  Right foot with swelling to dorsum. Tender throughout. NV intact.   ED Results / Procedures / Treatments   Labs (all labs ordered are listed, but only abnormal results are displayed) Labs Reviewed - No data to display   EKG  None   RADIOLOGY I independently interpreted and visualized the Right foot. My  interpretation: fracture Radiology interpretation:  IMPRESSION:  1. Minimally displaced fracture, base proximal phalanx right third  toe.  2. Mildly angulated fracture, neck of the second metatarsal.      PROCEDURES:  Critical Care performed: No    MEDICATIONS ORDERED IN ED: Medications - No data to display   IMPRESSION / MDM / ASSESSMENT AND PLAN / ED COURSE  I reviewed the triage vital signs and the nursing notes.                              Differential diagnosis includes, but is not limited to, fracture, dislocation, contusion  Patient's presentation is most consistent with acute presentation with potential threat to life or bodily function.   Patient presents to the emergency department today because of concerns for right foot pain after a mechanical fall.  On exam there is swelling and tenderness to the right foot.  Will obtain x-ray to evaluate for possible fracture.  X-ray does show  two fractures of the foot. Discussed this with the patient. Will place in CAM boot. Will have patient follow up with podiatry.  FINAL CLINICAL IMPRESSION(S) / ED DIAGNOSES   Final diagnoses:  Fall, initial encounter  Other fracture of right foot, initial encounter for closed fracture     Note:  This document was prepared using Dragon voice recognition software and may include unintentional dictation errors.    Phineas Semen, MD 08/09/23 [phone]

## 2023-08-09 NOTE — ED Triage Notes (Signed)
Patient BIB [facility] from home after tripping over a trash can while doing laundry. EMS reports patient had 1 ETOH beverage this AM but patient denies that at this time. Patient is c/o  right  foot pain 10/10.

## 2023-08-09 NOTE — ED Notes (Signed)
This tech provided pt with CAM boot. Pt was educated on how to put CAM boot on and how to inflate and deflate it.

## 2023-11-12 ENCOUNTER — Encounter: Payer: Self-pay | Admitting: Podiatry

## 2023-11-12 ENCOUNTER — Other Ambulatory Visit: Payer: Self-pay | Admitting: Podiatry

## 2023-11-12 ENCOUNTER — Ambulatory Visit (INDEPENDENT_AMBULATORY_CARE_PROVIDER_SITE_OTHER): Payer: Medicare PPO

## 2023-11-12 ENCOUNTER — Ambulatory Visit (INDEPENDENT_AMBULATORY_CARE_PROVIDER_SITE_OTHER): Payer: Medicare PPO | Admitting: Podiatry

## 2023-11-12 DIAGNOSIS — M2042 Other hammer toe(s) (acquired), left foot: Secondary | ICD-10-CM | POA: Diagnosis not present

## 2023-11-12 DIAGNOSIS — M778 Other enthesopathies, not elsewhere classified: Secondary | ICD-10-CM

## 2023-11-12 DIAGNOSIS — S99922A Unspecified injury of left foot, initial encounter: Secondary | ICD-10-CM | POA: Diagnosis not present

## 2023-11-12 MED ORDER — TRIAMCINOLONE ACETONIDE 10 MG/ML IJ SUSP
5.0000 mg | Freq: Once | INTRAMUSCULAR | Status: AC
Start: 2023-11-12 — End: 2023-11-12
  Administered 2023-11-12: 5 mg

## 2023-11-12 NOTE — Progress Notes (Unsigned)
Chief Complaint  Patient presents with   Foot Injury    He reports he is having increased pain with walking and has a hammer toe on the left second toe.     HPI: 77 y.o. male presents today with complaint of chronic pain to second toe left foot.  Patient used to get injections from Dr. Logan Bores to his second MPJ years ago.  Had been doing better for a while.  Several months ago fell and broke metatarsal bone and ankle right lower extremity, states he has been healing well and denies pain to the site.  Does state as he is increased his activity from this and undergone physical therapy the left foot pain has worsened and recurred over the past month or so.  Past Medical History:  Diagnosis Date   Bony exostosis 02/05/2016   Capsulitis of left foot 02/05/2016   Dental bridge present    permanent - top right   Enlarged prostate with lower urinary tract symptoms (LUTS) 09/01/2012   Essential hypertension 02/08/2017   GERD (gastroesophageal reflux disease)    Hammertoe 02/05/2016   IBS (irritable bowel syndrome) 12/18/2014   MVP (mitral valve prolapse) 10/19/2016   Primary osteoarthritis of left knee 03/17/2017   Wears partial dentures    upper left    Past Surgical History:  Procedure Laterality Date   ANTERIOR APPROACH HEMI HIP ARTHROPLASTY Left 10/19/2017   Procedure: ANTERIOR APPROACH HEMI HIP ARTHROPLASTY;  Surgeon: Kennedy Bucker, MD;  Location: [facility] ORS;  Service: Orthopedics;  Laterality: Left;   CATARACT EXTRACTION W/PHACO Right 12/06/2019   Procedure: CATARACT EXTRACTION PHACO AND INTRAOCULAR LENS PLACEMENT (IOC) RIGHT MALYUGIN 9.42  01:05.8  14.3%;  Surgeon: Lockie Mola, MD;  Location: [facility];  Service: Ophthalmology;  Laterality: Right;   COLONOSCOPY N/A 01/13/2021   Procedure: COLONOSCOPY;  Surgeon: Regis Bill, MD;  Location: [facility] ENDOSCOPY;  Service: Endoscopy;  Laterality: N/A;   ESOPHAGOGASTRODUODENOSCOPY (EGD) WITH PROPOFOL N/A 03/17/2018    Procedure: ESOPHAGOGASTRODUODENOSCOPY (EGD) WITH PROPOFOL;  Surgeon: Christena Deem, MD;  Location: [facility] ENDOSCOPY;  Service: Endoscopy;  Laterality: N/A;   JOINT REPLACEMENT Left 2018   KNEE CLOSED REDUCTION Left 01/25/2019   Procedure: CLOSED MANIPULATION KNEE LEFT;  Surgeon: Erin Sons, MD;  Location: [facility] ORS;  Service: Orthopedics;  Laterality: Left;   TOTAL KNEE ARTHROPLASTY Left 11/25/2018   URETEROSCOPY WITH HOLMIUM LASER LITHOTRIPSY  1978    Allergies  Allergen Reactions   Ciprofloxacin Other (See Comments)    Photosensitivity   Sulfa Antibiotics Rash    ROS    Physical Exam: There were no vitals filed for this visit.  General: The patient is alert and oriented x3 in no acute distress.  Dermatology: Skin is warm, dry and supple bilateral lower extremities. Interspaces are clear of maceration and debris. Painful left hallux nail deformity with thickening, elongation, yellow discoloration.  Vascular: Palpable pedal pulses bilaterally. Capillary refill within normal limits.  No appreciable edema.  No erythema or calor.  Neurological: Light touch sensation grossly intact bilateral feet.   Musculoskeletal Exam: Chronic arthritis appreciated to the first MPJs bilaterally with decreased range of motion.  There is chronic dislocation of the left second toe with pain on range of motion of the second toe. Hammertoe contractures of the lesser digits. Midfoot arthritis present with spurring of left 1st TMTJ.  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 Hammer toe contractures. Midfoot arthritis  with 1st TMTJ spurring.  Right foot healed fracture of second metatarsal neck with medial deviation.  Medial deviation of all toes with hammertoe contractures.  Medial deviation of first MPJ with cystic changes to the first met head.  Assessment/Plan of Care: 1. Capsulitis of foot   2. Hammertoe of left foot    3. Injury of plantar plate of left foot, initial encounter      Meds ordered this encounter  Medications   triamcinolone acetonide (KENALOG) 10 MG/ML injection 5 mg   None  Discussed clinical findings with patient today.  Plan: -Patient requesting injection of left second MPJ today.  Discussed with patient that this may alleviate his symptoms however he has chronic musculoskeletal deformity that is not reducible. -Did discuss conservative treatment such as shoe gear modification, use of wide toe box with mesh upper to avoid pressing on the toe. -Did discuss surgical treatment including second metatarsal osteotomy, possible peroneal reconstruction, hammertoe correction.  Discussed that the other toes may need to be addressed as well.  Fortunately there is not a significant bunion deformity on the left foot though there is first TMT J arthritis. Patient did ask about 2nd toe amputation for shorter recovery which could be a reasonable option as well -Proceeded with corticosteroid injection of the second MPJ after Betadine skin prep and discussion of risk, benefits alternative therapies.  Toes injected with 0.5 cc of betamethasone Celestone mixed with 0.5 cc of Marcaine plain.  Bandage applied. -Crest pad dispensed for the other remaining hammertoes.  Advised patient to purchase more of these if helpful. -Left hallux nail was debrided today using sterile nail nipper as a courtesy. Did discuss removal of the toenail at a future date. -Follow-up as needed   Brendy Ficek L. Marchia Bond, AACFAS [facility]     [address] [place], [place] [address]                Office [phone]  Fax [phone]

## 2023-11-12 NOTE — Patient Instructions (Signed)
More silicone pads can be purchased from:  [email or web address]  

## 2023-11-14 ENCOUNTER — Encounter: Payer: Self-pay | Admitting: Podiatry

## 2023-11-23 ENCOUNTER — Other Ambulatory Visit: Payer: Self-pay

## 2023-11-23 ENCOUNTER — Encounter: Payer: Self-pay | Admitting: Ophthalmology

## 2023-12-01 ENCOUNTER — Other Ambulatory Visit: Payer: Self-pay

## 2023-12-01 ENCOUNTER — Ambulatory Visit: Payer: Self-pay | Admitting: General Practice

## 2023-12-01 ENCOUNTER — Encounter
Admission: RE | Disposition: A | Discharge status: Home / Self Care | Payer: Self-pay | Source: Home / Self Care | Attending: Ophthalmology

## 2023-12-01 ENCOUNTER — Encounter: Payer: Self-pay | Admitting: Ophthalmology

## 2023-12-01 ENCOUNTER — Ambulatory Visit
Admission: RE | Admit: 2023-12-01 | Discharge: 2023-12-01 | Disposition: A | Discharge status: Home / Self Care | Payer: Medicare PPO | Attending: Ophthalmology | Admitting: Ophthalmology

## 2023-12-01 DIAGNOSIS — H2512 Age-related nuclear cataract, left eye: Secondary | ICD-10-CM | POA: Insufficient documentation

## 2023-12-01 DIAGNOSIS — H401122 Primary open-angle glaucoma, left eye, moderate stage: Secondary | ICD-10-CM | POA: Diagnosis not present

## 2023-12-01 DIAGNOSIS — F419 Anxiety disorder, unspecified: Secondary | ICD-10-CM | POA: Diagnosis not present

## 2023-12-01 DIAGNOSIS — Z8249 Family history of ischemic heart disease and other diseases of the circulatory system: Secondary | ICD-10-CM | POA: Diagnosis not present

## 2023-12-01 DIAGNOSIS — K219 Gastro-esophageal reflux disease without esophagitis: Secondary | ICD-10-CM | POA: Insufficient documentation

## 2023-12-01 DIAGNOSIS — F32A Depression, unspecified: Secondary | ICD-10-CM | POA: Insufficient documentation

## 2023-12-01 DIAGNOSIS — I10 Essential (primary) hypertension: Secondary | ICD-10-CM | POA: Diagnosis not present

## 2023-12-01 DIAGNOSIS — Z87891 Personal history of nicotine dependence: Secondary | ICD-10-CM | POA: Diagnosis not present

## 2023-12-01 DIAGNOSIS — M1990 Unspecified osteoarthritis, unspecified site: Secondary | ICD-10-CM | POA: Diagnosis not present

## 2023-12-01 HISTORY — DX: Other symptoms and signs involving the musculoskeletal system: R29.898

## 2023-12-01 HISTORY — PX: CATARACT EXTRACTION W/PHACO: SHX586

## 2023-12-01 HISTORY — DX: Polyneuropathy, unspecified: G62.9

## 2023-12-01 SURGERY — PHACOEMULSIFICATION, CATARACT, WITH IOL INSERTION
Anesthesia: Monitor Anesthesia Care | Site: Eye | Laterality: Left

## 2023-12-01 MED ORDER — CEFUROXIME OPHTHALMIC INJECTION 1 MG/0.1 ML
INJECTION | OPHTHALMIC | Status: DC | PRN
  Administered 2023-12-01: .1 mL via INTRACAMERAL

## 2023-12-01 MED ORDER — SIGHTPATH DOSE#1 NA CHONDROIT SULF-NA HYALURON 20-15 MG/0.5ML IO SOSY
INTRAOCULAR | Status: DC | PRN
  Administered 2023-12-01: .5 mL via INTRAOCULAR

## 2023-12-01 MED ORDER — SIGHTPATH DOSE#1 NA HYALUR & NA CHOND-NA HYALUR IO KIT
PACK | INTRAOCULAR | Status: DC | PRN
  Administered 2023-12-01: 1 via OPHTHALMIC

## 2023-12-01 MED ORDER — FENTANYL CITRATE (PF) 100 MCG/2ML IJ SOLN
INTRAMUSCULAR | Status: DC | PRN
  Administered 2023-12-01: 50 ug via INTRAVENOUS

## 2023-12-01 MED ORDER — SIGHTPATH DOSE#1 BSS IO SOLN
INTRAOCULAR | Status: DC | PRN
  Administered 2023-12-01: 70 mL via OPHTHALMIC

## 2023-12-01 MED ORDER — FENTANYL CITRATE (PF) 100 MCG/2ML IJ SOLN
INTRAMUSCULAR | Status: AC
  Filled 2023-12-01: qty 2

## 2023-12-01 MED ORDER — SIGHTPATH DOSE#1 BSS IO SOLN
INTRAOCULAR | Status: DC | PRN
  Administered 2023-12-01: 15 mL via INTRAOCULAR

## 2023-12-01 MED ORDER — ARMC OPHTHALMIC DILATING DROPS
1.0000 | OPHTHALMIC | Status: DC | PRN
  Administered 2023-12-01 (×3): 1 via OPHTHALMIC

## 2023-12-01 MED ORDER — TETRACAINE HCL 0.5 % OP SOLN
1.0000 [drp] | OPHTHALMIC | Status: DC | PRN
  Administered 2023-12-01 (×3): 1 [drp] via OPHTHALMIC

## 2023-12-01 MED ORDER — MIDAZOLAM HCL 2 MG/2ML IJ SOLN
INTRAMUSCULAR | Status: DC | PRN
  Administered 2023-12-01: 2 mg via INTRAVENOUS

## 2023-12-01 MED ORDER — TETRACAINE HCL 0.5 % OP SOLN
OPHTHALMIC | Status: AC
  Filled 2023-12-01: qty 4

## 2023-12-01 MED ORDER — MIDAZOLAM HCL 2 MG/2ML IJ SOLN
INTRAMUSCULAR | Status: AC
  Filled 2023-12-01: qty 2

## 2023-12-01 MED ORDER — SIGHTPATH DOSE#1 BSS IO SOLN
INTRAOCULAR | Status: DC | PRN
  Administered 2023-12-01: 1 mL

## 2023-12-01 SURGICAL SUPPLY — 11 items
BLADE DUAL KAHOOK SINGLE USE (BLADE) IMPLANT
CATARACT SUITE SIGHTPATH (MISCELLANEOUS) ×1
FEE CATARACT SUITE SIGHTPATH (MISCELLANEOUS) ×1 IMPLANT
GLOVE SRG 8 PF TXTR STRL LF DI (GLOVE) ×1 IMPLANT
GLOVE SURG ENC TEXT LTX SZ7.5 (GLOVE) ×1 IMPLANT
ICLIP (OPHTHALMIC RELATED) IMPLANT
LENS IOL TECNIS EYHANCE 21.5 (Intraocular Lens) IMPLANT
NDL FILTER BLUNT 18X1 1/2 (NEEDLE) ×1 IMPLANT
NEEDLE FILTER BLUNT 18X1 1/2 (NEEDLE) ×1
RING MALYGIN 7.0 (MISCELLANEOUS) IMPLANT
SYR 3ML LL SCALE MARK (SYRINGE) ×1 IMPLANT

## 2023-12-01 NOTE — H&P (Signed)
 [facility]   Primary Care Physician:  Little Riff, MD Ophthalmologist: Dr. Annell Kidney  Pre-Procedure History & Physical: HPI:  Roger Watson is a 78 y.o. male here for ophthalmic surgery.   Past Medical History:  Diagnosis Date   Bony exostosis 02/05/2016   Capsulitis of left foot 02/05/2016   Dental bridge present    permanent - top right   Enlarged prostate with lower urinary tract symptoms (LUTS) 09/01/2012   Essential hypertension 02/08/2017   GERD (gastroesophageal reflux disease)    Hammertoe 02/05/2016   IBS (irritable bowel syndrome) 12/18/2014   MVP (mitral valve prolapse) 10/19/2016   Primary osteoarthritis of left knee 03/17/2017   Wears partial dentures    upper left    Past Surgical History:  Procedure Laterality Date   ANTERIOR APPROACH HEMI HIP ARTHROPLASTY Left 10/19/2017   Procedure: ANTERIOR APPROACH HEMI HIP ARTHROPLASTY;  Surgeon: Molli Angelucci, MD;  Location: [facility] ORS;  Service: Orthopedics;  Laterality: Left;   CATARACT EXTRACTION W/PHACO Right 12/06/2019   Procedure: CATARACT EXTRACTION PHACO AND INTRAOCULAR LENS PLACEMENT (IOC) RIGHT MALYUGIN 9.42  01:05.8  14.3%;  Surgeon: Annell Kidney, MD;  Location: [facility];  Service: Ophthalmology;  Laterality: Right;   COLONOSCOPY N/A 01/13/2021   Procedure: COLONOSCOPY;  Surgeon: Shane Darling, MD;  Location: [facility] ENDOSCOPY;  Service: Endoscopy;  Laterality: N/A;   ESOPHAGOGASTRODUODENOSCOPY (EGD) WITH PROPOFOL  N/A 03/17/2018   Procedure: ESOPHAGOGASTRODUODENOSCOPY (EGD) WITH PROPOFOL ;  Surgeon: Deveron Fly, MD;  Location: [facility] ENDOSCOPY;  Service: Endoscopy;  Laterality: N/A;   JOINT REPLACEMENT Left 2018   KNEE CLOSED REDUCTION Left 01/25/2019   Procedure: CLOSED MANIPULATION KNEE LEFT;  Surgeon: Josephus Nida, MD;  Location: [facility] ORS;  Service: Orthopedics;  Laterality: Left;   TOTAL KNEE ARTHROPLASTY Left 11/25/2018   URETEROSCOPY WITH HOLMIUM LASER LITHOTRIPSY   1978    Prior to Admission medications   Medication Sig Start Date End Date Taking? Authorizing Provider  acetaminophen  (TYLENOL ) 500 MG tablet Take 1,000 mg by mouth every 8 (eight) hours as needed (pain/headaches.).  11/29/18  Yes [provider]  citalopram  (CELEXA ) 20 MG tablet Take 1 tablet (20 mg total) by mouth daily. 12/02/18  Yes Marilyne Shu, NP  doxazosin  (CARDURA ) 4 MG tablet Take 4 mg by mouth daily.    Yes [provider]  Glucosamine-Chondroitin (COSAMIN DS PO) Take 1 tablet by mouth daily.   Yes [provider]  HYDROcodone -acetaminophen  (NORCO/VICODIN) 5-325 MG tablet Take 1 tablet by mouth every 6 (six) hours as needed for moderate pain. 08/09/23 08/08/24 Yes Goodman, Graydon, MD  ketoconazole (NIZORAL) 2 % shampoo Apply 1 application topically daily.  11/29/18  Yes [provider]  latanoprost  (XALATAN ) 0.005 % ophthalmic solution Place 1 drop into both eyes at bedtime. 12/02/18  Yes Marilyne Shu, NP  meloxicam (MOBIC) 15 MG tablet Take 15 mg by mouth daily as needed for pain.   Yes [provider]  Multiple Vitamin (MULTIVITAMIN WITH MINERALS) TABS tablet Take 1 tablet by mouth daily.   Yes [provider]  oxycodone  (OXY-IR) 5 MG capsule Take 5 mg by mouth every 4 (four) hours as needed.   Yes [provider]  pantoprazole  (PROTONIX ) 40 MG tablet Take 1 tablet (40 mg total) by mouth daily. 12/02/18  Yes Marilyne Shu, NP  traMADol  (ULTRAM ) 50 MG tablet Take 1 tablet (50 mg total) by mouth every 6 (six) hours as needed. 12/02/18  Yes Marilyne Shu, NP  Allergies as of 11/03/2023 - Review Complete 08/09/2023  Allergen Reaction Noted   Ciprofloxacin Other (See Comments)    Sulfa antibiotics Rash 02/22/2014    Family History  Problem Relation Age of Onset   CAD Mother    CAD Father     Social History   Socioeconomic History   Marital status: Single    Spouse name: Not on file   Number of  children: Not on file   Years of education: Not on file   Highest education level: Not on file  Occupational History   Not on file  Tobacco Use   Smoking status: Former    Current packs/day: 0.00    Average packs/day: 1 pack/day for 7.0 years (7.0 ttl pk-yrs)    Types: Cigarettes    Start date: 11/16/1961    Quit date: 11/16/1968    Years since quitting: 55.0   Smokeless tobacco: Never   Tobacco comments:    smoked approx 13 years  Vaping Use   Vaping status: Never Used  Substance and Sexual Activity   Alcohol use: Yes    Alcohol/week: 14.0 standard drinks of alcohol    Types: 14 Standard drinks or equivalent per week    Comment: approx 2 mixed drinks/day   Drug use: No   Sexual activity: Not on file  Other Topics Concern   Not on file  Social History Narrative   Not on file   Social Drivers of Health   Financial Resource Strain: Not on file  Food Insecurity: Not on file  Transportation Needs: Not on file  Physical Activity: Not on file  Stress: Not on file  Social Connections: Not on file  Intimate Partner Violence: Not on file    Review of Systems: See HPI, otherwise negative ROS  Physical Exam: BP (!) 150/98   Pulse 95   Temp 99.7 F (37.6 C) (Temporal)   Resp (!) 21   Ht 5\' 7"  (1.702 m)   Wt 72.1 kg   SpO2 96%   BMI 24.90 kg/m  General:   Alert,  pleasant and cooperative in NAD Head:  Normocephalic and atraumatic. Lungs:  Clear to auscultation.    Heart:  Regular rate and rhythm.   Impression/Plan: Roger Watson is here for ophthalmic surgery.  Risks, benefits, limitations, and alternatives regarding ophthalmic surgery have been reviewed with the patient.  Questions have been answered.  All parties agreeable.   Annell Kidney, MD  12/01/2023, 10:02 AM

## 2023-12-01 NOTE — Anesthesia Postprocedure Evaluation (Signed)
 Anesthesia Post Note  Patient: Roger Watson  Procedure(s) Performed: CATARACT EXTRACTION PHACO AND INTRAOCULAR LENS PLACEMENT (IOC) LEFT KAHOOK DUAL BLADE GONIOTOMY MALYUGIN 18.38, 01:17.6 (Left: Eye)  Patient location during evaluation: PACU Anesthesia Type: MAC Level of consciousness: awake and alert Pain management: pain level controlled Vital Signs Assessment: post-procedure vital signs reviewed and stable Respiratory status: spontaneous breathing, nonlabored ventilation, respiratory function stable and patient connected to nasal cannula oxygen Cardiovascular status: stable and blood pressure returned to baseline Postop Assessment: no apparent nausea or vomiting Anesthetic complications: no   No notable events documented.   Last Vitals:  Vitals:   12/01/23 1129 12/01/23 1135  BP: (!) 140/100 138/89  Pulse: (!) 102 96  Resp: 19 17  Temp: 36.4 C 36.4 C  SpO2: 94% 96%    Last Pain:  Vitals:   12/01/23 1135  TempSrc:   PainSc: 0-No pain                 Virgal Warmuth C Herron Fero

## 2023-12-01 NOTE — Anesthesia Preprocedure Evaluation (Addendum)
 Anesthesia Evaluation  Patient identified by MRN, date of birth, ID band Patient awake    Reviewed: Allergy & Precautions, H&P , NPO status , Patient's Chart, lab work & pertinent test results  Airway Mallampati: II  TM Distance: >3 FB Neck ROM: Full    Dental no notable dental hx.    Pulmonary former smoker   Pulmonary exam normal breath sounds clear to auscultation       Cardiovascular hypertension, Normal cardiovascular exam Rhythm:Regular Rate:Normal     Neuro/Psych  PSYCHIATRIC DISORDERS Anxiety Depression    negative neurological ROS  negative psych ROS   GI/Hepatic negative GI ROS, Neg liver ROS,GERD  ,,  Endo/Other  negative endocrine ROS    Renal/GU negative Renal ROS  negative genitourinary   Musculoskeletal negative musculoskeletal ROS (+) Arthritis ,    Abdominal   Peds negative pediatric ROS (+)  Hematology negative hematology ROS (+)   Anesthesia Other Findings GERD (gastroesophageal reflux disease)  MVP (mitral valve prolapse) Essential hypertension  IBS (irritable bowel syndrome) Capsulitis of left foot  Hammertoe Bony exostosis  Enlarged prostate with lower urinary tract symptoms (LUTS) Dental bridge present Wears partial dentures Primary osteoarthritis of left knee  Neuropathy Left leg weakness   Reproductive/Obstetrics negative OB ROS                             Anesthesia Physical Anesthesia Plan  ASA: 2  Anesthesia Plan: MAC   Post-op Pain Management:    Induction: Intravenous  PONV Risk Score and Plan:   Airway Management Planned: Natural Airway and Nasal Cannula  Additional Equipment:   Intra-op Plan:   Post-operative Plan:   Informed Consent: I have reviewed the patients History and Physical, chart, labs and discussed the procedure including the risks, benefits and alternatives for the proposed anesthesia with the patient or authorized  representative who has indicated his/her understanding and acceptance.     Dental Advisory Given  Plan Discussed with: Anesthesiologist, CRNA and Surgeon  Anesthesia Plan Comments: (Patient consented for risks of anesthesia including but not limited to:  - adverse reactions to medications - damage to eyes, teeth, lips or other oral mucosa - nerve damage due to positioning  - sore throat or hoarseness - Damage to heart, brain, nerves, lungs, other parts of body or loss of life  Patient voiced understanding and assent.)        Anesthesia Quick Evaluation

## 2023-12-01 NOTE — Transfer of Care (Signed)
 Immediate Anesthesia Transfer of Care Note  Patient: Roger Watson  Procedure(s) Performed: CATARACT EXTRACTION PHACO AND INTRAOCULAR LENS PLACEMENT (IOC) LEFT [place] DUAL BLADE GONIOTOMY MALYUGIN 18.38, 01:17.6 (Left: Eye)  Patient Location: PACU  Anesthesia Type: MAC  Level of Consciousness: awake, alert  and patient cooperative  Airway and Oxygen Therapy: Patient Spontanous Breathing and Patient connected to supplemental oxygen  Post-op Assessment: Post-op Vital signs reviewed, Patient's Cardiovascular Status Stable, Respiratory Function Stable, Patent Airway and No signs of Nausea or vomiting  Post-op Vital Signs: Reviewed and stable  Complications: No notable events documented.

## 2023-12-01 NOTE — Discharge Instructions (Signed)

## 2023-12-01 NOTE — Op Note (Signed)
 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 Item Serial No. Manufacturer Lot No. LRB No. Used Action  LENS IOL TECNIS EYHANCE 21.5 - [id number] Intraocular Lens LENS IOL TECNIS EYHANCE 21.5 [id number] [employer]  Left 1 Implanted    SURGEON:  Berline Brenner, MD   ANESTHESIA:  Topical with tetracaine  drops augmented with 1% preservative-free intracameral lidocaine .    COMPLICATIONS:  None.   DESCRIPTION OF PROCEDURE:  The patient was identified in the holding room and transported to the operating room and placed in the supine position under the operating microscope.  The left eye was identified as the operative eye and it was prepped and draped in the usual sterile ophthalmic fashion.   A 1 millimeter clear-corneal paracentesis was made at the 5:30 position.  0.5 ml of preservative-free 1% lidocaine  was injected into the anterior chamber.  The anterior chamber was filled with Viscoat viscoelastic.  A 2.4 millimeter keratome was used to make a near-clear corneal incision at the 2:30 position. The microscope was adjusted and a gonioprism was used to visulaize the trabecular meshwork.  The [facility] Dual Blade was advanced across the anterior chamber under viscoelastic.  The blade was used to mark the trabecular meshwork at the 7:30 position.  The blade was placed two clock hours clockwise into the  meshwork.  Proper postioning was confirmed.  The blade ws passed counterclockwise through the meshwork to excise approximately two to three clock-hours of trabecular meshwork. A Malyugin ring was placed to expand the pupil to 7mm for cataract extraction.  A curvilinear capsulorrhexis was made with a cystotome and capsulorrhexis forceps.  Balanced salt  solution was used to hydrodissect and hydrodelineate the nucleus.   Phacoemulsification was then used in stop and chop fashion to remove the lens nucleus and epinucleus.  The remaining cortex was then removed using the irrigation and aspiration handpiece. Provisc was then placed into the capsular bag to distend it for lens placement.  A lens was then injected into the capsular bag. The Malyugin ring was removed. The remaining viscoelastic was aspirated.   Wounds were hydrated with balanced salt  solution.  The anterior chamber was inflated to a physiologic pressure with balanced salt  solution.  No wound leaks were noted. Cefuroxime  0.1 ml of a 10mg /ml solution was injected into the anterior chamber for a dose of 1 mg of intracameral antibiotic at the completion of the case.  The patient was taken to the recovery room in stable condition without complications of anesthesia or surgery.

## 2023-12-02 ENCOUNTER — Encounter: Payer: Self-pay | Admitting: Ophthalmology

## 2023-12-10 ENCOUNTER — Other Ambulatory Visit (INDEPENDENT_AMBULATORY_CARE_PROVIDER_SITE_OTHER): Payer: Self-pay | Admitting: Nurse Practitioner

## 2023-12-10 DIAGNOSIS — R29898 Other symptoms and signs involving the musculoskeletal system: Secondary | ICD-10-CM

## 2023-12-10 DIAGNOSIS — R262 Difficulty in walking, not elsewhere classified: Secondary | ICD-10-CM

## 2023-12-15 ENCOUNTER — Ambulatory Visit (INDEPENDENT_AMBULATORY_CARE_PROVIDER_SITE_OTHER): Payer: Medicare PPO | Admitting: Nurse Practitioner

## 2023-12-15 ENCOUNTER — Ambulatory Visit (INDEPENDENT_AMBULATORY_CARE_PROVIDER_SITE_OTHER): Payer: Medicare PPO

## 2023-12-15 ENCOUNTER — Encounter (INDEPENDENT_AMBULATORY_CARE_PROVIDER_SITE_OTHER): Payer: Self-pay | Admitting: Nurse Practitioner

## 2023-12-15 VITALS — BP 86/61 | HR 74 | Resp 18 | Ht 67.0 in | Wt 158.4 lb

## 2023-12-15 DIAGNOSIS — I739 Peripheral vascular disease, unspecified: Secondary | ICD-10-CM

## 2023-12-15 DIAGNOSIS — I10 Essential (primary) hypertension: Secondary | ICD-10-CM

## 2023-12-15 DIAGNOSIS — R262 Difficulty in walking, not elsewhere classified: Secondary | ICD-10-CM

## 2023-12-15 DIAGNOSIS — R29898 Other symptoms and signs involving the musculoskeletal system: Secondary | ICD-10-CM | POA: Diagnosis not present

## 2023-12-16 NOTE — Progress Notes (Signed)
Subjective:    Patient ID: Roger Watson, male    DOB: 01-09-1946, 78 y.o.   MRN: [id number] Chief Complaint  Patient presents with   New Patient (Initial Visit)    NP. ABI/consult. difficulty walking/weakness. Marlene Bast    The patient is a 78 year old male who was referred by his neurologist Nilda Calamity, PA-C in regards to concern for possible vascular disease.  The patient has already feels as a sort of weakness in his left lower extremity.  He notes that he feels shaky and unstable as well as some tightness sensation along the lateral portion of the leg.  He denies classic claudication-like symptoms.  He denies open wounds or ulcerations.  He is fairly active daily and notes that he does not have anything that would reconsider claudication-like symptoms.  Today he has noncompressible ABIs.  However he has strong triphasic tibial artery waveforms bilaterally.  He has a 1.13 TBI on the right and 0.33 on the left.  He does note that when he was younger he cut off the tip of his left toe very nearly in a garden accident.  Despite the lower numbers he has good waveforms to the toe itself.    Review of Systems  Neurological:  Positive for weakness.  All other systems reviewed and are negative.      Objective:   Physical Exam Vitals reviewed.  HENT:     Head: Normocephalic.  Cardiovascular:     Rate and Rhythm: Normal rate.     Pulses: Normal pulses.  Pulmonary:     Effort: Pulmonary effort is normal.  Skin:    General: Skin is warm and dry.  Neurological:     Mental Status: He is alert and oriented to person, place, and time.  Psychiatric:        Mood and Affect: Mood normal.        Behavior: Behavior normal.        Thought Content: Thought content normal.        Judgment: Judgment normal.     BP (!) 86/61   Pulse 74   Resp 18   Ht 5\' 7"  (1.702 m)   Wt 158 lb 6.4 oz (71.8 kg)   BMI 24.81 kg/m   Past Medical History:  Diagnosis Date   Bony exostosis 02/05/2016    Capsulitis of left foot 02/05/2016   Dental bridge present    permanent - top right   Enlarged prostate with lower urinary tract symptoms (LUTS) 09/01/2012   Essential hypertension 02/08/2017   GERD (gastroesophageal reflux disease)    Hammertoe 02/05/2016   IBS (irritable bowel syndrome) 12/18/2014   Left leg weakness    MVP (mitral valve prolapse) 10/19/2016   Neuropathy    Primary osteoarthritis of left knee 03/17/2017   Wears partial dentures    upper left    Social History   Socioeconomic History   Marital status: Single    Spouse name: Not on file   Number of children: Not on file   Years of education: Not on file   Highest education level: Not on file  Occupational History   Not on file  Tobacco Use   Smoking status: Former    Current packs/day: 0.00    Average packs/day: 1 pack/day for 7.0 years (7.0 ttl pk-yrs)    Types: Cigarettes    Start date: 11/16/1961    Quit date: 11/16/1968    Years since quitting: 55.1   Smokeless tobacco: Never   Tobacco  comments:    smoked approx 13 years  Vaping Use   Vaping status: Never Used  Substance and Sexual Activity   Alcohol use: Yes    Alcohol/week: 14.0 standard drinks of alcohol    Types: 14 Standard drinks or equivalent per week    Comment: approx 2 mixed drinks/day   Drug use: No   Sexual activity: Not on file  Other Topics Concern   Not on file  Social History Narrative   Not on file   Social Drivers of Health   Financial Resource Strain: Not on file  Food Insecurity: Not on file  Transportation Needs: Not on file  Physical Activity: Not on file  Stress: Not on file  Social Connections: Not on file  Intimate Partner Violence: Not on file    Past Surgical History:  Procedure Laterality Date   ANTERIOR APPROACH HEMI HIP ARTHROPLASTY Left 10/19/2017   Procedure: ANTERIOR APPROACH HEMI HIP ARTHROPLASTY;  Surgeon: Kennedy Bucker, MD;  Location: [facility] OR[facility];  Service: Orthopedics;  Laterality: Left;   CATARACT  EXTRACTION W/PHACO Right 12/06/2019   Procedure: CATARACT EXTRACTION PHACO AND INTRAOCULAR LENS PLACEMENT (IOC) RIGHT MALYUGIN 9.42  01:05.8  14.3%;  Surgeon: Lockie Mola, MD;  Location: [facility];  Service: Ophthalmology;  Laterality: Right;   CATARACT EXTRACTION W/PHACO Left 12/01/2023   Procedure: CATARACT EXTRACTION PHACO AND INTRAOCULAR LENS PLACEMENT (IOC) LEFT KAHOOK DUAL BLADE GONIOTOMY MALYUGIN 18.38, 01:17.6;  Surgeon: Lockie Mola, MD;  Location: [facility];  Service: Ophthalmology;  Laterality: Left;   COLONOSCOPY N/A 01/13/2021   Procedure: COLONOSCOPY;  Surgeon: Regis Bill, MD;  Location: [facility] ENDOSCOPY;  Service: Endoscopy;  Laterality: N/A;   ESOPHAGOGASTRODUODENOSCOPY (EGD) WITH PROPOFOL N/A 03/17/2018   Procedure: ESOPHAGOGASTRODUODENOSCOPY (EGD) WITH PROPOFOL;  Surgeon: Christena Deem, MD;  Location: [facility] END[facility]COPY;  Service: Endoscopy;  Laterality: N/A;   JOINT REPLACEMENT Left 2018   KNEE CLOSED REDUCTION Left 01/25/2019   Procedure: CLOSED MANIPULATION KNEE LEFT;  Surgeon: Erin Sons, MD;  Location: [facility] ORS;  Service: Orthopedics;  Laterality: Left;   TOTAL KNEE ARTHROPLASTY Left 11/25/2018   URETEROSCOPY WITH HOLMIUM LASER LITHOTRIPSY  1978    Family History  Problem Relation Age of Onset   CAD Mother    CAD Father     Allergies  Allergen Reactions   Ciprofloxacin Other (See Comments)    Photosensitivity   Sulfa Antibiotics Rash       Latest Ref Rng & Units 03/17/2018   10:31 AM 10/21/2017    2:55 AM 10/20/2017    4:35 AM  CBC  WBC 3.8 - 10.6 K/uL 7.7  10.8  10.0   Hemoglobin 13.0 - 18.0 g/dL 96.2  95.2  84.1   Hematocrit 40.0 - 52.0 % 43.0  36.8  40.3   Platelets 150 - 440 K/uL 201  137  149       CMP     Component Value Date/Time   NA 136 10/20/2017 0435   NA 143 08/21/2012 2223   K 4.2 10/20/2017 0435   K 4.2 08/21/2012 2223   CL 105 10/20/2017 0435   CL 109 (H) 08/21/2012 2223   CO2 24  10/20/2017 0435   CO2 26 08/21/2012 2223   GLUCOSE 172 (H) 10/20/2017 0435   GLUCOSE 118 (H) 08/21/2012 2223   BUN 11 10/20/2017 0435   BUN 17 08/21/2012 2223   CREATININE 0.67 10/20/2017 0435   CREATININE 1.12 08/21/2012 2223   CALCIUM 8.5 (L) 10/20/2017 0435   CALCIUM  9.4 08/21/2012 2223   PROT 6.7 10/19/2017 0419   PROT 7.7 08/21/2012 2223   ALBUMIN 4.3 10/19/2017 0419   ALBUMIN 4.3 08/21/2012 2223   AST 29 10/19/2017 0419   AST 32 08/21/2012 2223   ALT 21 10/19/2017 0419   ALT 34 08/21/2012 2223   ALKPHOS 46 10/19/2017 0419   ALKPHOS 68 08/21/2012 2223   BILITOT 1.2 10/19/2017 0419   BILITOT 1.5 (H) 08/21/2012 2223   GFRNONAA >60 10/20/2017 0435   GFRNONAA >60 08/21/2012 2223     No results found.     Assessment & Plan:   1. PAD (peripheral artery disease) (HCC) (Primary) I had a long discussion with the patient regarding his studies.  His studies today are largely normal with the exception of the diminished toe waveforms on the left.  I suspect this is actually due to a normal toe injury where he severely damaged the toe itself.  Currently his symptoms are not consistent with symptoms of vascular disease such as claudication, rest pain or ulceration.  I suspect that his concerns are related to his underlying neuropathy as well as possibly musculoskeletal issues from his lower back.  I would recommend continuing to monitor this on an annual basis however.  He is advised to follow-up sooner if he begins to develop significant claudication, rest pain or ulceration  2. Essential hypertension Continue antihypertensive medications as already ordered, these medications have been reviewed and there are no changes at this time.   Current Outpatient Medications on File Prior to Visit  Medication Sig Dispense Refill   acetaminophen (TYLENOL) 500 MG tablet Take 1,000 mg by mouth every 8 (eight) hours as needed (pain/headaches.).      brimonidine (ALPHAGAN) 0.2 % ophthalmic solution  SMARTSIG:In Eye(s)     citalopram (CELEXA) 20 MG tablet Take 1 tablet (20 mg total) by mouth daily. 30 tablet 0   doxazosin (CARDURA) 4 MG tablet Take 4 mg by mouth daily.      Glucosamine-Chondroitin (COSAMIN DS PO) Take 1 tablet by mouth daily.     HYDROcodone-acetaminophen (NORCO/VICODIN) 5-325 MG tablet Take 1 tablet by mouth every 6 (six) hours as needed for moderate pain. 15 tablet 0   ketoconazole (NIZORAL) 2 % shampoo Apply 1 application topically daily.      latanoprost (XALATAN) 0.005 % ophthalmic solution Place 1 drop into both eyes at bedtime. 2.5 mL 0   meloxicam (MOBIC) 15 MG tablet Take 15 mg by mouth daily as needed for pain.     Multiple Vitamin (MULTIVITAMIN WITH MINERALS) TABS tablet Take 1 tablet by mouth daily.     oxycodone (OXY-IR) 5 MG capsule Take 5 mg by mouth every 4 (four) hours as needed.     pantoprazole (PROTONIX) 40 MG tablet Take 1 tablet (40 mg total) by mouth daily. 30 tablet 0   traMADol (ULTRAM) 50 MG tablet Take 1 tablet (50 mg total) by mouth every 6 (six) hours as needed. 15 tablet 0   No current facility-administered medications on file prior to visit.    There are no Patient Instructions on file for this visit. No follow-ups on file.   Georgiana Spinner, NP

## 2023-12-24 ENCOUNTER — Other Ambulatory Visit: Payer: Self-pay | Admitting: Physician Assistant

## 2023-12-24 DIAGNOSIS — N63 Unspecified lump in unspecified breast: Secondary | ICD-10-CM

## 2023-12-27 ENCOUNTER — Encounter: Payer: Self-pay | Admitting: Physician Assistant

## 2023-12-28 ENCOUNTER — Other Ambulatory Visit: Payer: Self-pay | Admitting: Physician Assistant

## 2023-12-28 DIAGNOSIS — N63 Unspecified lump in unspecified breast: Secondary | ICD-10-CM

## 2023-12-28 DIAGNOSIS — N644 Mastodynia: Secondary | ICD-10-CM

## 2023-12-30 ENCOUNTER — Ambulatory Visit
Admission: RE | Admit: 2023-12-30 | Discharge: 2023-12-30 | Disposition: A | Discharge status: Home / Self Care | Payer: Medicare PPO | Source: Ambulatory Visit | Attending: Physician Assistant | Admitting: Physician Assistant

## 2023-12-30 DIAGNOSIS — N644 Mastodynia: Secondary | ICD-10-CM

## 2023-12-30 DIAGNOSIS — N63 Unspecified lump in unspecified breast: Secondary | ICD-10-CM | POA: Diagnosis present

## 2024-04-04 ENCOUNTER — Encounter (INDEPENDENT_AMBULATORY_CARE_PROVIDER_SITE_OTHER): Payer: Self-pay

## 2024-11-24 ENCOUNTER — Ambulatory Visit: Admitting: Anesthesiology

## 2024-11-24 ENCOUNTER — Ambulatory Visit
Admission: RE | Admit: 2024-11-24 | Discharge: 2024-11-24 | Disposition: A | Discharge status: Home / Self Care | Attending: Gastroenterology | Admitting: Gastroenterology

## 2024-11-24 ENCOUNTER — Encounter
Admission: RE | Disposition: A | Discharge status: Home / Self Care | Payer: Self-pay | Source: Home / Self Care | Attending: Gastroenterology

## 2024-11-24 DIAGNOSIS — K5730 Diverticulosis of large intestine without perforation or abscess without bleeding: Secondary | ICD-10-CM | POA: Diagnosis not present

## 2024-11-24 DIAGNOSIS — I10 Essential (primary) hypertension: Secondary | ICD-10-CM | POA: Insufficient documentation

## 2024-11-24 DIAGNOSIS — K219 Gastro-esophageal reflux disease without esophagitis: Secondary | ICD-10-CM | POA: Diagnosis not present

## 2024-11-24 DIAGNOSIS — K640 First degree hemorrhoids: Secondary | ICD-10-CM | POA: Diagnosis not present

## 2024-11-24 DIAGNOSIS — N400 Enlarged prostate without lower urinary tract symptoms: Secondary | ICD-10-CM | POA: Diagnosis not present

## 2024-11-24 DIAGNOSIS — Z1211 Encounter for screening for malignant neoplasm of colon: Secondary | ICD-10-CM | POA: Insufficient documentation

## 2024-11-24 DIAGNOSIS — K589 Irritable bowel syndrome without diarrhea: Secondary | ICD-10-CM | POA: Insufficient documentation

## 2024-11-24 DIAGNOSIS — Z79899 Other long term (current) drug therapy: Secondary | ICD-10-CM | POA: Insufficient documentation

## 2024-11-24 DIAGNOSIS — D123 Benign neoplasm of transverse colon: Secondary | ICD-10-CM | POA: Insufficient documentation

## 2024-11-24 HISTORY — PX: COLONOSCOPY: SHX5424

## 2024-11-24 HISTORY — PX: POLYPECTOMY: SHX149

## 2024-11-24 SURGERY — COLONOSCOPY
Anesthesia: General

## 2024-11-24 MED ORDER — PROPOFOL 500 MG/50ML IV EMUL
INTRAVENOUS | Status: DC | PRN
  Administered 2024-11-24: 50 ug/kg/min via INTRAVENOUS

## 2024-11-24 MED ORDER — LIDOCAINE HCL (PF) 2 % IJ SOLN
INTRAMUSCULAR | Status: AC
  Filled 2024-11-24: qty 5

## 2024-11-24 MED ORDER — SODIUM CHLORIDE 0.9 % IV SOLN
INTRAVENOUS | Status: DC
  Administered 2024-11-24: 500 mL via INTRAVENOUS

## 2024-11-24 MED ORDER — PROPOFOL 10 MG/ML IV BOLUS
INTRAVENOUS | Status: DC | PRN
  Administered 2024-11-24 (×2): 20 mg via INTRAVENOUS
  Administered 2024-11-24: 10 mg via INTRAVENOUS
  Administered 2024-11-24: 30 mg via INTRAVENOUS
  Administered 2024-11-24 (×2): 10 mg via INTRAVENOUS

## 2024-11-24 MED ORDER — LIDOCAINE HCL (CARDIAC) PF 100 MG/5ML IV SOSY
PREFILLED_SYRINGE | INTRAVENOUS | Status: DC | PRN
  Administered 2024-11-24: 60 mg via INTRAVENOUS

## 2024-11-24 NOTE — Op Note (Signed)
 [facility]astro[facility] Patient Name: Roger Watson Procedure Date: 11/24/2024 8:58 AM MRN: [id number] Account #: [id number] Date of Birth: [birth year] Admit Type: Outpatient Age: 79 Room: [facility] ENDO ROOM 3 Gender: Male Note Status: Finalized Instrument Name: Colon Scope [id number] Procedure:             Colonoscopy Indications:           Screening for colorectal malignant neoplasm, Colon                         cancer screening in patient at increased risk: Family                         history of 1st-degree relative with colon polyps Providers:             Ole Schick MD, MD Referring MD:          Norleen RONAL Louder, MD (Referring MD) Medicines:             Monitored Anesthesia Care Complications:         No immediate complications. Estimated blood loss:                         Minimal. Procedure:             Pre-Anesthesia Assessment:                        - Prior to the procedure, a History and Physical was                         performed, and patient medications and allergies were                         reviewed. The patient is competent. The risks and                         benefits of the procedure and the sedation options and                         risks were discussed with the patient. All questions                         were answered and informed consent was obtained.                         Patient identification and proposed procedure were                         verified by the physician, the nurse, the                         anesthesiologist, the anesthetist and the technician                         in the endoscopy suite. Mental Status Examination:                         alert and oriented. Airway Examination: normal  oropharyngeal airway and neck mobility. Respiratory                         Examination: clear to auscultation. CV Examination:                         normal. Prophylactic Antibiotics: The patient  does not                         require prophylactic antibiotics. Prior                         Anticoagulants: The patient has taken no anticoagulant                         or antiplatelet agents. ASA Grade Assessment: II - A                         patient with mild systemic disease. After reviewing                         the risks and benefits, the patient was deemed in                         satisfactory condition to undergo the procedure. The                         anesthesia plan was to use monitored anesthesia care                         (MAC). Immediately prior to administration of                         medications, the patient was re-assessed for adequacy                         to receive sedatives. The heart rate, respiratory                         rate, oxygen saturations, blood pressure, adequacy of                         pulmonary ventilation, and response to care were                         monitored throughout the procedure. The physical                         status of the patient was re-assessed after the                         procedure.                        After obtaining informed consent, the colonoscope was                         passed under direct vision. Throughout the procedure,  the patient's blood pressure, pulse, and oxygen                         saturations were monitored continuously. The                         Colonoscope was introduced through the anus and                         advanced to the the cecum, identified by appendiceal                         orifice and ileocecal valve. The colonoscopy was                         somewhat difficult due to significant looping.                         Successful completion of the procedure was aided by                         applying abdominal pressure. The patient tolerated the                         procedure well. The quality of the bowel preparation                          was adequate to identify polyps. The ileocecal valve,                         appendiceal orifice, and rectum were photographed. Findings:      The perianal and digital rectal examinations were normal.      Multiple large-mouthed and small-mouthed diverticula were found in the       sigmoid colon, descending colon, transverse colon and ascending colon.      Two sessile polyps were found in the transverse colon. The polyps were 2       to 3 mm in size. These polyps were removed with a cold snare. Resection       and retrieval were complete. Estimated blood loss was minimal.      Internal hemorrhoids were found during retroflexion. The hemorrhoids       were Grade I (internal hemorrhoids that do not prolapse).      The exam was otherwise without abnormality on direct and retroflexion       views. Impression:            - Diverticulosis in the sigmoid colon, in the                         descending colon, in the transverse colon and in the                         ascending colon.                        - Two 2 to 3 mm polyps in the transverse colon,  removed with a cold snare. Resected and retrieved.                        - Internal hemorrhoids.                        - The examination was otherwise normal on direct and                         retroflexion views. Recommendation:        - Discharge patient to home.                        - Resume previous diet.                        - Continue present medications.                        - Await pathology results.                        - Repeat colonoscopy is not recommended due to current                         age (61 years or older) for surveillance.                        - Return to referring physician as previously                         scheduled. Procedure Code(s):     --- Professional ---                        [phone], Colonoscopy, flexible; with removal of                         tumor(s),  polyp(s), or other lesion(s) by snare                         technique Diagnosis Code(s):     --- Professional ---                        Z12.11, Encounter for screening for malignant neoplasm                         of colon                        Z83.71, Family history of colonic polyps                        K64.0, First degree hemorrhoids                        D12.3, Benign neoplasm of transverse colon (hepatic                         flexure or splenic flexure)  K57.30, Diverticulosis of large intestine without                         perforation or abscess without bleeding CPT copyright 2022 [employer]. All rights reserved. The codes documented in this report are preliminary and upon coder review may  be revised to meet current compliance requirements. Ole Schick MD, MD 11/24/2024 9:37:10 AM Number of Addenda: 0 Note Initiated On: 11/24/2024 8:58 AM Scope Withdrawal Time: 0 hours 13 minutes 9 seconds  Total Procedure Duration: 0 hours 24 minutes 16 seconds  Estimated Blood Loss:  Estimated blood loss was minimal.      [facility]

## 2024-11-24 NOTE — Anesthesia Preprocedure Evaluation (Addendum)
 "                                  Anesthesia Evaluation  Patient identified by MRN, date of birth, ID band Patient awake    Reviewed: Allergy & Precautions, NPO status , Patient's Chart, lab work & pertinent test results  Airway Mallampati: II  TM Distance: >3 FB Neck ROM: full    Dental  (+) Teeth Intact, Partial Upper, Partial Lower   Pulmonary Patient abstained from smoking., former smoker   Pulmonary exam normal breath sounds clear to auscultation       Cardiovascular Exercise Tolerance: Good hypertension, Pt. on medications Normal cardiovascular exam Rhythm:Regular Rate:Normal     Neuro/Psych   Anxiety     negative neurological ROS  negative psych ROS   GI/Hepatic negative GI ROS, Neg liver ROS,GERD  Medicated,,  Endo/Other  negative endocrine ROS    Renal/GU negative Renal ROS  negative genitourinary   Musculoskeletal   Abdominal Normal abdominal exam  (+)   Peds negative pediatric ROS (+)  Hematology negative hematology ROS (+)   Anesthesia Other Findings Past Medical History: 02/05/2016: Bony exostosis 02/05/2016: Capsulitis of left foot No date: Dental bridge present     Comment:  permanent - top right 09/01/2012: Enlarged prostate with lower urinary tract symptoms (LUTS) 02/08/2017: Essential hypertension No date: GERD (gastroesophageal reflux disease) 02/05/2016: Hammertoe 12/18/2014: IBS (irritable bowel syndrome) No date: Left leg weakness 10/19/2016: MVP (mitral valve prolapse) No date: Neuropathy 03/17/2017: Primary osteoarthritis of left knee No date: Wears partial dentures     Comment:  upper left  Past Surgical History: 10/19/2017: ANTERIOR APPROACH HEMI HIP ARTHROPLASTY; Left     Comment:  Procedure: ANTERIOR APPROACH HEMI HIP ARTHROPLASTY;                Surgeon: Kathlynn Sharper, MD;  Location: [facility];                Service: Orthopedics;  Laterality: Left; 12/06/2019: CATARACT EXTRACTION W/PHACO; Right     Comment:   Procedure: CATARACT EXTRACTION PHACO AND INTRAOCULAR               LENS PLACEMENT (IOC) RIGHT MALYUGIN 9.42  01:05.8  14.3%;              Surgeon: Mittie Gaskin, MD;  Location: [facility];  Service: Ophthalmology;  Laterality:               Right; 12/01/2023: CATARACT EXTRACTION W/PHACO; Left     Comment:  Procedure: CATARACT EXTRACTION PHACO AND INTRAOCULAR               LENS PLACEMENT (IOC) LEFT KAHOOK DUAL BLADE GONIOTOMY               MALYUGIN 18.38, 01:17.6;  Surgeon: Mittie Gaskin,               MD;  Location: [facility];  Service:               Ophthalmology;  Laterality: Left; 01/13/2021: COLONOSCOPY; N/A     Comment:  Procedure: COLONOSCOPY;  Surgeon: Maryruth Ole DASEN,               MD;  Location: [facility];  Service: Endoscopy;  Laterality: N/A; 03/17/2018: ESOPHAGOGASTRODUODENOSCOPY (EGD) WITH PROPOFOL ; N/A     Comment:  Procedure: ESOPHAGOGASTRODUODENOSCOPY (EGD) WITH               PROPOFOL ;  Surgeon: Gaylyn Gladis PENNER, MD;  Location:               [facility];  Service: Endoscopy;  Laterality: N/A; No date: EYE SURGERY 2018: JOINT REPLACEMENT; Left 01/25/2019: KNEE CLOSED REDUCTION; Left     Comment:  Procedure: CLOSED MANIPULATION KNEE LEFT;  Surgeon:               [name] Barters, MD;  Location: [facility];  Service:               Orthopedics;  Laterality: Left; 11/25/2018: TOTAL KNEE ARTHROPLASTY; Left 1978: URETEROSCOPY WITH HOLMIUM LASER LITHOTRIPSY  BMI    Body Mass Index: 25.06 kg/m      Reproductive/Obstetrics negative OB ROS                              Anesthesia Physical Anesthesia Plan  ASA: 2  Anesthesia Plan: General   Post-op Pain Management:    Induction: Intravenous  PONV Risk Score and Plan: Propofol  infusion and TIVA  Airway Management Planned: Natural Airway and Nasal Cannula  Additional Equipment:   Intra-op Plan:   Post-operative  Plan:   Informed Consent: I have reviewed the patients History and Physical, chart, labs and discussed the procedure including the risks, benefits and alternatives for the proposed anesthesia with the patient or authorized representative who has indicated his/her understanding and acceptance.     Dental Advisory Given  Plan Discussed with: CRNA  Anesthesia Plan Comments:          Anesthesia Quick Evaluation  "

## 2024-11-24 NOTE — Transfer of Care (Signed)
 Immediate Anesthesia Transfer of Care Note  Patient: Roger Watson  Procedure(s) Performed: COLONOSCOPY POLYPECTOMY, INTESTINE  Patient Location: PACU  Anesthesia Type:General  Level of Consciousness: sedated  Airway & Oxygen Therapy: Patient Spontanous Breathing  Post-op Assessment: Report given to RN and Post -op Vital signs reviewed and stable  Post vital signs: Reviewed and stable  Last Vitals:  Vitals Value Taken Time  BP    Temp    Pulse 76 11/24/24 09:33  Resp    SpO2 96 % 11/24/24 09:33  Vitals shown include unfiled device data.  Last Pain:  Vitals:   11/24/24 0829  TempSrc: Temporal  PainSc: 0-No pain         Complications: No notable events documented.

## 2024-11-24 NOTE — H&P (Signed)
 Outpatient short stay form Pre-procedure 11/24/2024  Roger ONEIDA Schick, MD  Primary Physician: Rudolpho Norleen BIRCH, MD  Reason for visit:  Screening  History of present illness:    79 y/o gentleman with history of IBS and BPH here for colonoscopy for screening. Last colonoscopy in 2022 was unremarkable but inadequate prep. No blood thinners. No family history of GI malignancies. No abdominal surgeries.   Current Medications[1]  Medications Prior to Admission  Medication Sig Dispense Refill Last Dose/Taking   brimonidine  (ALPHAGAN ) 0.2 % ophthalmic solution SMARTSIG:In Eye(s)   Past Week   citalopram  (CELEXA ) 20 MG tablet Take 1 tablet (20 mg total) by mouth daily. 30 tablet 0 Past Week   doxazosin  (CARDURA ) 4 MG tablet Take 4 mg by mouth daily.    Past Week   Glucosamine-Chondroitin (COSAMIN DS PO) Take 1 tablet by mouth daily.   Past Week   latanoprost  (XALATAN ) 0.005 % ophthalmic solution Place 1 drop into both eyes at bedtime. 2.5 mL 0 11/23/2024   Multiple Vitamin (MULTIVITAMIN WITH MINERALS) TABS tablet Take 1 tablet by mouth daily.   Past Week   pantoprazole  (PROTONIX ) 40 MG tablet Take 1 tablet (40 mg total) by mouth daily. 30 tablet 0 Past Week   acetaminophen  (TYLENOL ) 500 MG tablet Take 1,000 mg by mouth every 8 (eight) hours as needed (pain/headaches.).       ketoconazole (NIZORAL) 2 % shampoo Apply 1 application topically daily.       meloxicam (MOBIC) 15 MG tablet Take 15 mg by mouth daily as needed for pain.      oxycodone  (OXY-IR) 5 MG capsule Take 5 mg by mouth every 4 (four) hours as needed.      traMADol  (ULTRAM ) 50 MG tablet Take 1 tablet (50 mg total) by mouth every 6 (six) hours as needed. (Patient not taking: Reported on 11/24/2024) 15 tablet 0 Not Taking     Allergies[2]   Past Medical History:  Diagnosis Date   Bony exostosis 02/05/2016   Capsulitis of left foot 02/05/2016   Dental bridge present    permanent - top right   Enlarged prostate with lower urinary  tract symptoms (LUTS) 09/01/2012   Essential hypertension 02/08/2017   GERD (gastroesophageal reflux disease)    Hammertoe 02/05/2016   IBS (irritable bowel syndrome) 12/18/2014   Left leg weakness    MVP (mitral valve prolapse) 10/19/2016   Neuropathy    Primary osteoarthritis of left knee 03/17/2017   Wears partial dentures    upper left    Review of systems:  Otherwise negative.    Physical Exam  Gen: Alert, oriented. Appears stated age.  HEENT: PERRLA. Lungs: No respiratory distress CV: RRR Abd: soft, benign, no masses Ext: No edema    Planned procedures: Proceed with colonoscopy. The patient understands the nature of the planned procedure, indications, risks, alternatives and potential complications including but not limited to bleeding, infection, perforation, damage to internal organs and possible oversedation/side effects from anesthesia. The patient agrees and gives consent to proceed.  Please refer to procedure notes for findings, recommendations and patient disposition/instructions.     Roger ONEIDA Schick, MD [facility]         [1]  Current Facility-Administered Medications:    0.9 %  sodium chloride  infusion, , Intravenous, Continuous, Alexandr Yaworski, Roger ONEIDA, MD, Last Rate: 20 mL/hr at 11/24/24 0852, 500 mL at 11/24/24 0852 [2]  Allergies Allergen Reactions   Ciprofloxacin Other (See Comments)    Photosensitivity   Sulfa Antibiotics  Rash

## 2024-11-24 NOTE — Interval H&P Note (Signed)
 History and Physical Interval Note:  11/24/2024 8:56 AM  Roger Watson  has presented today for surgery, with the diagnosis of Family history of polyps in the colon (Z83.719).  The various methods of treatment have been discussed with the patient and family. After consideration of risks, benefits and other options for treatment, the patient has consented to  Procedures: COLONOSCOPY (N/A) as a surgical intervention.  [name] history has been reviewed, patient examined, no change in status, stable for surgery.  I have reviewed the patient's chart and labs.  Questions were answered to the patient's satisfaction.     Roger Watson  Ok to proceed with colonoscopy

## 2024-11-24 NOTE — Anesthesia Postprocedure Evaluation (Signed)
"   Anesthesia Post Note  Patient: Roger Watson  Procedure(s) Performed: COLONOSCOPY POLYPECTOMY, INTESTINE  Patient location during evaluation: PACU Anesthesia Type: General Level of consciousness: awake Pain management: pain level controlled Vital Signs Assessment: post-procedure vital signs reviewed and stable Respiratory status: nonlabored ventilation Cardiovascular status: blood pressure returned to baseline Anesthetic complications: no   No notable events documented.   Last Vitals:  Vitals:   11/24/24 0953 11/24/24 0956  BP: (!) 164/104 (!) 164/104  Pulse: 74 69  Resp: 20 (!) 21  Temp:    SpO2: 100% 100%    Last Pain:  Vitals:   11/24/24 0953  TempSrc:   PainSc: 0-No pain                 VAN STAVEREN,Alveda Vanhorne      "

## 2024-11-27 LAB — SURGICAL PATHOLOGY

## 2024-12-14 ENCOUNTER — Ambulatory Visit (INDEPENDENT_AMBULATORY_CARE_PROVIDER_SITE_OTHER): Payer: Medicare PPO | Admitting: Nurse Practitioner

## 2024-12-14 ENCOUNTER — Encounter (INDEPENDENT_AMBULATORY_CARE_PROVIDER_SITE_OTHER): Payer: Medicare PPO
